# Patient Record
Sex: Male | Born: 1935
Health system: Southern US, Community
[De-identification: ages and names within clinical notes are randomized; demographics above are authoritative.]

## PROBLEM LIST (undated history)

## (undated) DIAGNOSIS — M1990 Unspecified osteoarthritis, unspecified site: Secondary | ICD-10-CM

## (undated) DIAGNOSIS — C801 Malignant (primary) neoplasm, unspecified: Secondary | ICD-10-CM

## (undated) DIAGNOSIS — C4490 Unspecified malignant neoplasm of skin, unspecified: Secondary | ICD-10-CM

## (undated) DIAGNOSIS — H9190 Unspecified hearing loss, unspecified ear: Secondary | ICD-10-CM

## (undated) DIAGNOSIS — C439 Malignant melanoma of skin, unspecified: Secondary | ICD-10-CM

## (undated) DIAGNOSIS — M545 Low back pain, unspecified: Secondary | ICD-10-CM

## (undated) DIAGNOSIS — I10 Essential (primary) hypertension: Secondary | ICD-10-CM

## (undated) HISTORY — DX: Unspecified malignant neoplasm of skin, unspecified: C44.90

## (undated) HISTORY — DX: Essential (primary) hypertension: I10

## (undated) HISTORY — DX: Malignant (primary) neoplasm, unspecified: C80.1

## (undated) HISTORY — DX: Low back pain, unspecified: M54.50

## (undated) HISTORY — DX: Malignant melanoma of skin, unspecified: C43.9

## (undated) HISTORY — DX: Unspecified osteoarthritis, unspecified site: M19.90

## (undated) HISTORY — DX: Unspecified hearing loss, unspecified ear: H91.90

---

## 2004-06-16 ENCOUNTER — Ambulatory Visit: Admission: RE | Admit: 2004-06-16 | Payer: Self-pay | Source: Ambulatory Visit | Admitting: Gastroenterology

## 2005-10-26 HISTORY — PX: INGUINAL HERNIA REPAIR: SUR1180

## 2005-10-26 HISTORY — PX: EXCISION, MELANOMA: SHX3990

## 2006-09-25 DIAGNOSIS — C439 Malignant melanoma of skin, unspecified: Secondary | ICD-10-CM | POA: Insufficient documentation

## 2012-10-28 NOTE — Op Note (Unsigned)
DATE OF BIRTH:                        [birth year]      ADMISSION DATE:                     06/16/2004            PATIENT LOCATION:                     END [facility] 24            DATE OF PROCEDURE:                   06/16/2004      SURGEON:                            Homero Fellers, MD      ASSISTANT(S):                  PREOPERATIVE DIAGNOSIS:  ROUTINE COLORECTAL CANCER SCREENING IN A PATIENT      WITH NORMAL AGE-RELATED RISK FOR COLON CANCER.            POSTOPERATIVE DIAGNOSIS:  MILDLY CONGESTED INTERNAL HEMORRHOIDS, OTHERWISE      NORMAL COLONOSCOPY.            PROCEDURE:  COLONOSCOPY.            SCOPE:  Olympus video colonoscope.            PREMEDICATIONS:  Propofol.            DESCRIPTION OF PROCEDURE:  Perianal inspection was unrevealing.  Digital      examination reveals no masses.  The scope was passed transanally and      further advanced to the base of cecum and the appendiceal orifice is      visualized (photo documentation).  The ileocecal valve is well seen, (photo      documentation).  Bowel prep was good.  The scope was slowly withdrawn      demonstrating a normal colon without polyps or visualized diverticulosis.      There were mildly congested internal hemorrhoids visualized on withdrawal.      Retroflex views of the anorectum was otherwise unrevealing (photo      documentation).  The scope was withdrawn.  The procedure was tolerated well      without overt complications.            RECOMMENDATIONS:   A next screening colonoscopy may now be deferred for up      to  10 years according to current concensus guidelines.                                          ___________________________________          Date Signed: __________      Homero Fellers, MD  ([id number])            D: 06/16/2004 by Homero Fellers, MD      T: 06/16/2004 by [id number] ([id number]) ([id number])      cc:  Homero Fellers, MD            Dr. Cruzita Lederer, [address]  [phone], [place], [place]. [phone]

## 2014-06-23 ENCOUNTER — Emergency Department
Admission: EM | Admit: 2014-06-23 | Discharge: 2014-06-23 | Disposition: A | Discharge status: Home / Self Care | Payer: BLUE CROSS/BLUE SHIELD

## 2014-06-23 ENCOUNTER — Emergency Department: Payer: BLUE CROSS/BLUE SHIELD

## 2014-06-23 DIAGNOSIS — N476 Balanoposthitis: Secondary | ICD-10-CM | POA: Insufficient documentation

## 2014-06-23 DIAGNOSIS — N390 Urinary tract infection, site not specified: Secondary | ICD-10-CM

## 2014-06-23 LAB — URINALYSIS
Bilirubin, UA: NEGATIVE
Glucose, UA: NEGATIVE
Nitrite, UA: NEGATIVE
Protein, UR: 100 — AB
Specific Gravity UA: 1.025 (ref 1.001–1.035)
Urine pH: 6 (ref 5.0–8.0)
Urobilinogen, UA: 0.2 mg/dL (ref 0.2–2.0)

## 2014-06-23 LAB — URINE MICROSCOPIC

## 2014-06-23 MED ORDER — CEFDINIR 300 MG PO CAPS
300.0000 mg | ORAL_CAPSULE | Freq: Two times a day (BID) | ORAL | Status: DC
Start: 2014-06-23 — End: 2015-12-03

## 2014-06-23 MED ORDER — PHENAZOPYRIDINE HCL 200 MG PO TABS
200.0000 mg | ORAL_TABLET | Freq: Three times a day (TID) | ORAL | Status: DC | PRN
Start: 2014-06-23 — End: 2015-12-03

## 2014-06-23 MED ORDER — TRIAMCINOLONE ACETONIDE 0.1 % EX CREA
TOPICAL_CREAM | Freq: Two times a day (BID) | CUTANEOUS | Status: DC
Start: 2014-06-23 — End: 2015-12-03

## 2014-06-23 NOTE — Discharge Instructions (Signed)
Urinary Tract Infection    You have been diagnosed with a lower urinary tract infection (UTI). This is also called cystitis.also you appear to have some infection of foreskin/ followup by your dr    Cystitis is an infection in your bladder. Your doctor diagnosed it by testing your urine. Cystitis usually causes burning with urination or frequent urination. It might make you feel like you have to urinate even when you don't.     Cystitis is usually treated with antibiotics and medicine to help with pain.    It is VERY IMPORTANT that you fill your prescription and take all of the antibiotics as directed. If a lower urinary tract infection goes untreated for too long, it can become a kidney infection.    FOR WOMEN: To reduce the risk of getting cystitis again:   Always urinate before and after sexual intercourse.   Always wipe from front to back after urinating or having a bowel movement. Do not wipe from back to front.   Drink plenty of fluids. Try to drink cranberry or blueberry juice. These juices have a chemical that stops bacteria from "sticking" to the bladder.    YOU SHOULD SEEK MEDICAL ATTENTION IMMEDIATELY, EITHER HERE OR AT THE NEAREST EMERGENCY DEPARTMENT, IF ANY OF THE FOLLOWING OCCURS:   You have a fever (temperature higher than 100.46F / 38C) or shaking chills.   You feel nauseated or vomit.   You have pain in your side or back.   You don't get better after taking all of your antibiotics.   You have any new symptoms or concerns.   You feel worse or do not improve.

## 2014-06-23 NOTE — ED Provider Notes (Addendum)
Physician/Midlevel provider first contact with patient: 06/23/14 1723         History     Chief Complaint   Patient presents with   . Urinary Frequency   . Abdominal Pain     Patient is a 80 y.o. male presenting with frequency and abdominal pain. The history is provided by the patient.   Urinary Frequency  This is a new problem. The current episode started in the past 7 days. Associated symptoms include urinary symptoms. Pertinent negatives include no abdominal pain. Associated symptoms comments: Burning urination/ no pain no back pain only noticed during urination.   Abdominal Pain        Past Medical History   Diagnosis Date   . Melanoma        Past Surgical History   Procedure Laterality Date   . Inguinal hernia repair Left        No family history on file.    Social  History   Substance Use Topics   . Smoking status: Never Smoker    . Smokeless tobacco: Not on file   . Alcohol Use: Yes      Comment: 7/week       .     No Known Allergies    Discharge Medication List as of 06/23/2014  6:07 PM           Review of Systems   Gastrointestinal: Negative for abdominal pain.   Genitourinary: Positive for frequency.   All other systems reviewed and are negative.      Physical Exam    BP: 179/82 mmHg, Heart Rate: 83, Temp: 98.1 F (36.7 C), Resp Rate: 15, SpO2: 98 %, Weight: 64.864 kg    Physical Exam   Constitutional: He appears well-developed and well-nourished.   Abdominal: Soft. Bowel sounds are normal. He exhibits no distension. There is no tenderness. There is no rebound.   Genitourinary: No penile tenderness.   Uncircumcised/ somewhat redundant foreskin / no disch but tight foreskin and area is erythematous   Musculoskeletal: Normal range of motion.   Neurological: He is alert. Coordination normal.   Skin: Skin is warm and dry.   Foreskin area sl red / no disch       MDM and ED Course     ED Medication Orders     None           MDM      Procedures    Clinical Impression & Disposition     Clinical Impression  Final  diagnoses:   UTI (lower urinary tract infection)   Balanoposthitis        ED Disposition     Discharge Leona Manni discharge to home/self care.    Condition at disposition: Stable             Discharge Medication List as of 06/23/2014  6:07 PM      START taking these medications    Details   cefdinir (OMNICEF) 300 MG capsule Take 1 capsule (300 mg total) by mouth 2 (two) times daily., Starting 06/23/2014, Until Discontinued, Print      phenazopyridine (PYRIDIUM) 200 MG tablet Take 1 tablet (200 mg total) by mouth 3 (three) times daily as needed for Pain (as needed for pain or burning)., Starting 06/23/2014, Until Discontinued, Print      triamcinolone (KENALOG) 0.1 % cream Apply topically 2 (two) times daily., Starting 06/23/2014, Until Discontinued, Print  Ok Anis, MD  06/23/14 1807    Ok Anis, MD  06/24/14 [phone]

## 2014-06-23 NOTE — ED Notes (Signed)
Urinary frequency over a month without a focus found by PCP 3 weeks ago.  Yesterday the urine became cloudy and frequency increased a lot.  Intense lower abdominal pain with urination, does not need to push, but only small amounts at a time.

## 2015-12-03 ENCOUNTER — Ambulatory Visit: Payer: BLUE CROSS/BLUE SHIELD

## 2015-12-03 NOTE — Pre-Procedure Instructions (Addendum)
   Labs/EKG done Nov/Dec 2016 requested from Catherine/PMD's office    No pre-op clearances requested   LOV note 04/2015 requested from Heidi/med onc's office.

## 2015-12-23 ENCOUNTER — Ambulatory Visit
Admission: RE | Admit: 2015-12-23 | Payer: BLUE CROSS/BLUE SHIELD | Source: Ambulatory Visit | Admitting: Gastroenterology

## 2015-12-23 ENCOUNTER — Encounter: Admission: RE | Payer: Self-pay | Source: Ambulatory Visit

## 2015-12-23 SURGERY — DONT USE, USE 1094-COLONOSCOPY, DIAGNOSTIC (SCREENING)
Anesthesia: IV Sedation | Site: Anus

## 2016-10-07 DIAGNOSIS — E785 Hyperlipidemia, unspecified: Secondary | ICD-10-CM | POA: Insufficient documentation

## 2018-03-09 DIAGNOSIS — M25562 Pain in left knee: Secondary | ICD-10-CM | POA: Insufficient documentation

## 2018-03-09 DIAGNOSIS — M1990 Unspecified osteoarthritis, unspecified site: Secondary | ICD-10-CM | POA: Insufficient documentation

## 2018-03-09 DIAGNOSIS — M47812 Spondylosis without myelopathy or radiculopathy, cervical region: Secondary | ICD-10-CM | POA: Insufficient documentation

## 2018-03-17 ENCOUNTER — Emergency Department
Admission: EM | Admit: 2018-03-17 | Discharge: 2018-03-18 | Disposition: A | Discharge status: Home / Self Care | Payer: BLUE CROSS/BLUE SHIELD | Attending: Emergency Medicine | Admitting: Emergency Medicine

## 2018-03-17 ENCOUNTER — Emergency Department: Payer: BLUE CROSS/BLUE SHIELD

## 2018-03-17 DIAGNOSIS — R55 Syncope and collapse: Secondary | ICD-10-CM | POA: Insufficient documentation

## 2018-03-17 DIAGNOSIS — Z79899 Other long term (current) drug therapy: Secondary | ICD-10-CM | POA: Insufficient documentation

## 2018-03-17 DIAGNOSIS — I10 Essential (primary) hypertension: Secondary | ICD-10-CM | POA: Insufficient documentation

## 2018-03-17 DIAGNOSIS — Z85820 Personal history of malignant melanoma of skin: Secondary | ICD-10-CM | POA: Insufficient documentation

## 2018-03-17 LAB — CBC AND DIFFERENTIAL
Absolute NRBC: 0 10*3/uL (ref 0.00–0.00)
Basophils Absolute Automated: 0.05 10*3/uL (ref 0.00–0.08)
Basophils Automated: 0.6 %
Eosinophils Absolute Automated: 0.26 10*3/uL (ref 0.00–0.44)
Eosinophils Automated: 3 %
Hematocrit: 39.9 % (ref 37.6–49.6)
Hgb: 13 g/dL (ref 12.5–17.1)
Immature Granulocytes Absolute: 0.02 10*3/uL (ref 0.00–0.07)
Immature Granulocytes: 0.2 %
Lymphocytes Absolute Automated: 2.24 10*3/uL (ref 0.42–3.22)
Lymphocytes Automated: 26 %
MCH: 31.9 pg (ref 25.1–33.5)
MCHC: 32.6 g/dL (ref 31.5–35.8)
MCV: 97.8 fL — ABNORMAL HIGH (ref 78.0–96.0)
MPV: 10.4 fL (ref 8.9–12.5)
Monocytes Absolute Automated: 1.1 10*3/uL — ABNORMAL HIGH (ref 0.21–0.85)
Monocytes: 12.8 %
Neutrophils Absolute: 4.93 10*3/uL (ref 1.10–6.33)
Neutrophils: 57.4 %
Nucleated RBC: 0 /100 WBC (ref 0.0–0.0)
Platelets: 230 10*3/uL (ref 142–346)
RBC: 4.08 10*6/uL — ABNORMAL LOW (ref 4.20–5.90)
RDW: 13 % (ref 11–15)
WBC: 8.6 10*3/uL (ref 3.10–9.50)

## 2018-03-17 LAB — PT AND APTT
PT INR: 1 (ref 0.9–1.1)
PT: 13.4 s (ref 12.6–15.0)
PTT: 23 s (ref 23–37)

## 2018-03-17 LAB — COMPREHENSIVE METABOLIC PANEL
ALT: 19 U/L (ref 0–55)
AST (SGOT): 22 U/L (ref 5–34)
Albumin/Globulin Ratio: 1.3 (ref 0.9–2.2)
Albumin: 4.2 g/dL (ref 3.5–5.0)
Alkaline Phosphatase: 89 U/L (ref 38–106)
Anion Gap: 15 (ref 5.0–15.0)
BUN: 13 mg/dL (ref 9.0–28.0)
Bilirubin, Total: 0.6 mg/dL (ref 0.2–1.2)
CO2: 22 mEq/L (ref 22–29)
Calcium: 8.9 mg/dL (ref 7.9–10.2)
Chloride: 106 mEq/L (ref 100–111)
Creatinine: 1 mg/dL (ref 0.7–1.3)
Globulin: 3.2 g/dL (ref 2.0–3.6)
Glucose: 123 mg/dL — ABNORMAL HIGH (ref 70–100)
Potassium: 4.2 mEq/L (ref 3.5–5.1)
Protein, Total: 7.4 g/dL (ref 6.0–8.3)
Sodium: 143 mEq/L (ref 136–145)

## 2018-03-17 LAB — ETHANOL: Alcohol: 12 mg/dL — ABNORMAL HIGH

## 2018-03-17 LAB — GFR: EGFR: >60

## 2018-03-17 LAB — MAGNESIUM: Magnesium: 2.1 mg/dL (ref 1.6–2.6)

## 2018-03-17 LAB — TROPONIN I: Troponin I: <0.01 ng/mL (ref 0.00–0.09)

## 2018-03-17 LAB — B-TYPE NATRIURETIC PEPTIDE: B-Natriuretic Peptide: 65 pg/mL (ref 0–100)

## 2018-03-17 MED ORDER — ONDANSETRON HCL 4 MG/2ML IJ SOLN
4.0000 mg | Freq: Once | INTRAMUSCULAR | Status: AC
Start: 2018-03-17 — End: 2018-03-17
  Administered 2018-03-17: 4 mg via INTRAVENOUS
  Filled 2018-03-17: qty 2

## 2018-03-17 MED ORDER — SODIUM CHLORIDE 0.9 % IV BOLUS
1000.0000 mL | Freq: Once | INTRAVENOUS | Status: AC
Start: 2018-03-17 — End: 2018-03-17
  Administered 2018-03-17: 1000 mL via INTRAVENOUS

## 2018-03-17 NOTE — ED Provider Notes (Signed)
Physician/Midlevel provider first contact with patient: 03/17/18 2048         History     Chief Complaint   Patient presents with   . Loss of Consciousness   . Emesis     Pt is a 84 yo male with hx of HTN, RA, etc., who comes to ER via EMS for evaluation of a sudden onset of weakness with lightheadness and syncopal episode prior to arrival. Pt was sitting at a table , eating, started to feel weak, denies any headache or chest pain at the time. Pt was witnessed to slump over the table, + syncope for few seconds, family laid him on a floor and raised his legs up, he woke up, this was around 2015. He was then able to ambulate with assistance to the bathroom and vomited there several times. EMS were called and brought pt to ER .   In ER, states he is still feeling weak and nauseous. Denies any headache or chest pain.   States today he has been feeling "off all today, didn't eat much at all and then went to the rehersal dinner". His daughter is getting married tomorrow.   No prior hx of syncopal episode.         The history is provided by the patient.            Past Medical History:   Diagnosis Date   . Arthritis     both hands   . Hearing loss, unspecified laterality     bilaterally, mild loss   . Hypertension    . Low back pain     occasionally   . Malignant neoplasm     Melanoma - LN in groin positive 10/2011   . Malignant neoplasm of skin     Melanoma - back - surgically removed   . Melanoma        Past Surgical History:   Procedure Laterality Date   . EXCISION, MELANOMA  2007    Removed from back and groin LN tested positive   . INGUINAL HERNIA REPAIR Left 2007       History reviewed. No pertinent family history.    Social  Social History   Substance Use Topics   . Smoking status: Former Smoker     Packs/day: 0.50     Years: 4.00   . Smokeless tobacco: Former Neurosurgeon     Quit date: 12/02/1957      Comment: Smoked pipe until 2000 infrequently   . Alcohol use 4.2 oz/week     7 Glasses of wine per week      Comment: 7/week        .     No Known Allergies    Home Medications     Med List Status:  In Progress Set By: Candelaria Celeste, RN at 03/17/2018  8:50 PM                ramipril (ALTACE) 2.5 MG capsule     TAKE 1 CAPSULE(S) EVERY DAY BY ORAL ROUTE AS DIRECTED.           Review of Systems   Constitutional: Negative for chills and fever.   HENT: Negative for sore throat.    Respiratory: Negative for cough.    Cardiovascular: Negative for chest pain and leg swelling.   Gastrointestinal: Negative for abdominal pain, nausea and vomiting.   Genitourinary: Negative.    Musculoskeletal: Negative for back pain.   Skin: Negative for rash.  Neurological: Positive for dizziness, syncope and light-headedness. Negative for speech difficulty, weakness, numbness and headaches.   Psychiatric/Behavioral: Negative.    All other systems reviewed and are negative.      Physical Exam    BP: 137/73, Heart Rate: (!) 59, Temp: 97.9 F (36.6 C), Resp Rate: 22, SpO2: 99 %, Weight: 65.8 kg    Physical Exam   Constitutional: He is oriented to person, place, and time. He appears well-developed and well-nourished. No distress.   Weak appearing   HENT:   Head: Normocephalic and atraumatic.   Right Ear: External ear normal.   Left Ear: External ear normal.   Nose: Nose normal.   Mouth/Throat: Oropharynx is clear and moist.   Eyes: Pupils are equal, round, and reactive to light. Conjunctivae and EOM are normal.   Neck: Normal range of motion. Neck supple.   Cardiovascular: Normal rate, regular rhythm, normal heart sounds and intact distal pulses.    Pulmonary/Chest: Effort normal and breath sounds normal. No respiratory distress.   Abdominal: Soft. Bowel sounds are normal. There is no tenderness.   Musculoskeletal: Normal range of motion. He exhibits no edema.   Neurological: He is alert and oriented to person, place, and time. He has normal reflexes. No cranial nerve deficit. Coordination normal.   Skin: Skin is warm and dry. Capillary refill takes less than 2  seconds. He is not diaphoretic.   Psychiatric: He has a normal mood and affect. His behavior is normal. Judgment and thought content normal.   Nursing note and vitals reviewed.        MDM and ED Course     ED Medication Orders     Start Ordered     Status Ordering Provider    03/17/18 2049 03/17/18 2048  ondansetron (ZOFRAN) injection 4 mg  Once     Route: Intravenous  Ordered Dose: 4 mg     Last MAR action:  Given Tiphani Mells J    03/17/18 2049 03/17/18 2048  sodium chloride 0.9 % bolus 1,000 mL  Once     Route: Intravenous  Ordered Dose: 1,000 mL     Last MAR action:  Stopped Danijah Noh J             MDM  Number of Diagnoses or Management Options  Syncope, vasovagal:   Diagnosis management comments: I, Phyllis Ginger PA-C, have been the primary provider for Jason Valentine during this Emergency Dept visit.    The attending signature signifies review and agreement of the history, physical examination, evaluation, clinical impression and plan except as noted.     Oxygen saturation by pulse oximetry is 95%-100%, Normal.  Interventions: None Needed and Patient Observed.    This chart was generated by the Epic EMR system/speech recognition and may contain inherent errors or omissions not intended by the user. Grammatical errors, random word insertions, deletions, pronoun errors and incomplete sentences are occasional consequences of this technology due to software limitations. Not all errors are caught or corrected. If there are questions or concerns about the content of this note or information contained within the body of this dictation they should be addressed directly with the author for clarification    EKG Interpretation    Rate: Normal  Rhythm: sinus rhythm  Axis: Normal  ST-T Segments: no ST elevation    Conduction: RBBB (Complete)  Impression: Non-specific EKG    EKG interpreted by ER MD    Xr Chest  Ap Portable    Result Date:  03/17/2018  1. No acute findings. Emeline Darling, MD 03/17/2018 10:40  PM    11:00 PM  Pt well appearing, wife at bedside. Pt denies any discomfort. Drinking water. Repeat Trop/EKG at midnight.  Dr. Salli Real will disposition pt as appropriate           Amount and/or Complexity of Data Reviewed  Clinical lab tests: ordered and reviewed  Tests in the radiology section of CPT: ordered and reviewed      Results     Procedure Component Value Units Date/Time    Troponin I - Repeat [[id number]] Collected:  03/17/18 2357    Specimen:  Blood Updated:  03/18/18 0030     Troponin I <0.01 ng/mL     B-type Natriuretic Peptide [[id number]] Collected:  03/17/18 2054    Specimen:  Blood Updated:  03/17/18 2131     B-Natriuretic Peptide 65 pg/mL     Troponin I [[id number]] Collected:  03/17/18 2054    Specimen:  Blood Updated:  03/17/18 2131     Troponin I <0.01 ng/mL     Ethanol (Alcohol) Level [[id number]]  (Abnormal) Collected:  03/17/18 2054    Specimen:  Blood Updated:  03/17/18 2131     Alcohol 12 (H) mg/dL     GFR [[id number]] Collected:  03/17/18 2054     Updated:  03/17/18 2131     EGFR >60.0    Comprehensive metabolic panel [[id number]]  (Abnormal) Collected:  03/17/18 2054    Specimen:  Blood Updated:  03/17/18 2131     Glucose 123 (H) mg/dL      BUN 53.6 mg/dL      Creatinine 1.0 mg/dL      Sodium 644 mEq/L      Potassium 4.2 mEq/L      Chloride 106 mEq/L      CO2 22 mEq/L      Calcium 8.9 mg/dL      Protein, Total 7.4 g/dL      Albumin 4.2 g/dL      AST (SGOT) 22 U/L      ALT 19 U/L      Alkaline Phosphatase 89 U/L      Bilirubin, Total 0.6 mg/dL      Globulin 3.2 g/dL      Albumin/Globulin Ratio 1.3     Anion Gap 15.0    Magnesium [[id number]] Collected:  03/17/18 2054    Specimen:  Blood Updated:  03/17/18 2131     Magnesium 2.1 mg/dL     PT/APTT [[id number]] Collected:  03/17/18 2054     Updated:  03/17/18 2123     PT 13.4 sec      PT INR 1.0     PT Anticoag. Given Within 48 hrs. None     PTT 23 sec     CBC with differential [[id number]]  (Abnormal) Collected:  03/17/18 2054    Specimen:   Blood from Blood Updated:  03/17/18 2108     WBC 8.60 x10 3/uL      Hgb 13.0 g/dL      Hematocrit 60.6 %      Platelets 230 x10 3/uL      RBC 4.08 (L) x10 6/uL      MCV 97.8 (H) fL      MCH 31.9 pg      MCHC 32.6 g/dL      RDW 13 %      MPV 10.4 fL      Neutrophils 57.4 %  Lymphocytes Automated 26.0 %      Monocytes 12.8 %      Eosinophils Automated 3.0 %      Basophils Automated 0.6 %      Immature Granulocyte 0.2 %      Nucleated RBC 0.0 /100 WBC      Neutrophils Absolute 4.93 x10 3/uL      Abs Lymph Automated 2.24 x10 3/uL      Abs Mono Automated 1.10 (H) x10 3/uL      Abs Eos Automated 0.26 x10 3/uL      Absolute Baso Automated 0.05 x10 3/uL      Absolute Immature Granulocyte 0.02 x10 3/uL      Absolute NRBC 0.00 x10 3/uL                    Heart Score      Value   History  0   EKG  0   Risk Factors  1   Total (with age)  3   Onset of pain (time of START of last episode of chest pain)?  0-3 hrs ago          Procedures    Clinical Impression & Disposition     Clinical Impression  Final diagnoses:   Syncope, vasovagal        ED Disposition     ED Disposition Condition Date/Time Comment    Discharge  Fri Mar 18, 2018 12:49 AM Jason Valentine discharge to home/self care.    Condition at disposition: Stable           Discharge Medication List as of 03/18/2018 12:49 AM                    Paula Compton, PA  03/18/18 1341       Leonia Reeves, MD  03/26/18 [phone]

## 2018-03-17 NOTE — ED Triage Notes (Signed)
Per wife pt was sitting and started slumping in chair, felt weak, sweaty and pale, sit with assist, pulse weak, loss consciousness, laid on floor and raised legs resulted in return to consciousness at 2015 tonight. Vomited in bathroom after episode, + nausea, alert and oriented.

## 2018-03-18 LAB — TROPONIN I: Troponin I: <0.01 ng/mL (ref 0.00–0.09)

## 2018-03-18 NOTE — Discharge Instructions (Signed)
Dizziness, Nonspecific    You have been seen for dizziness.     Dizziness can mean different things to different people. Some people use dizziness to mean the feeling of spinning when there is no actual movement. This often causes nausea (feeling sick). The medical term for this is "vertigo." Others people use the word dizzy to mean "feeling lightheaded," like you might faint. This feeling is usually made better when lying down. For some people, neither of these describes how they are feeling. It can just be a feeling that makes you unsteady. This feeling is common in older people. It can be caused by a number of things. These include poor vision or hearing, foot problems and arthritis. It can also be caused by middle ear or sinus problems. The feeling can come and go.    Dizziness is also caused by more serious things. This includes strokes and heart problems.    It is NEVER normal to have the kind of dizziness you have today together with:   Chest pain.   Problems walking because of problems with balance. Especially if you are falling to one side.   Weakness, numbness or tingling in a part of your body.   Drooping of one side of your face.   Confusion.   Severe headache.   Problems speaking.    If you have these symptoms, it is VERY IMPORTANT to go to the nearest emergency department.    Your tests today were negative (normal). This means we found no life-threatening causes for your dizziness. It is OK for you to go home.    See your primary care doctor for more work-up of your dizziness.     YOU SHOULD SEEK MEDICAL ATTENTION IMMEDIATELY, EITHER HERE OR AT THE NEAREST EMERGENCY DEPARTMENT, IF ANY OF THE FOLLOWING OCCUR:   You cannot speak clearly (slurring), one side of your face droops or you feel weak in the arms or legs (especially on one side).   You have problems with your balance.   You have problems hearing or there is ringing or a feeling of fullness in your ear.   You lose consciousness  ("pass out" or faint).   You have severe headache with dizziness.   You have fever (temperature higher than 100.4F / 38C).   You fall and hit your head.               Vasovagal Syncope    You have been seen for vasovagal syncope.    Vasovagal syncope is the medical term for fainting. It happens when your blood pressure changes so quickly that your body cannot make up for it. This causes loss of consciousness that comes on quickly and lasts only a short time. Many people with vasovagal syncope have warning signs before they faint. These signs might include nausea, weakness, sweating, or blurred vision.     Causes of vasovagal syncope include:   Standing for a long time with the knees locked.   Emotional stress (fear, excitement).   Hot or crowed environments.   Illness, fatigue, dehydration, hypoglycemia (low blood sugar).    The cause of your fainting is not life-threatening. Still, you should avoid strenuous activity for the next 24 hours and drink plenty of fluids. If you are diabetic, keep good control over your blood sugar.    See your family doctor within 72 hours.    YOU SHOULD SEEK MEDICAL ATTENTION IMMEDIATELY, EITHER HERE OR AT THE NEAREST EMERGENCY DEPARTMENT IF ANY OF THE   FOLLOWING OCCUR:   You continue to faint.   Your fainting occurs with chest pain or a very fast heart rate (more than 120 beats per minute).   You notice any blood in your stool.   You feel weak or lightheaded.   Your symptoms get worse.   You have any other concerns.

## 2018-03-19 LAB — ECG 12-LEAD
Atrial Rate: 63 {beats}/min
Atrial Rate: 73 {beats}/min
P Axis: 56 degrees
P Axis: 62 degrees
P-R Interval: 174 ms
P-R Interval: 178 ms
Q-T Interval: 410 ms
Q-T Interval: 446 ms
QRS Duration: 120 ms
QRS Duration: 124 ms
QTC Calculation (Bezet): 451 ms
QTC Calculation (Bezet): 456 ms
R Axis: 27 degrees
R Axis: 30 degrees
T Axis: 24 degrees
T Axis: 28 degrees
Ventricular Rate: 63 {beats}/min
Ventricular Rate: 73 {beats}/min

## 2018-04-08 DIAGNOSIS — G479 Sleep disorder, unspecified: Secondary | ICD-10-CM | POA: Insufficient documentation

## 2018-04-08 DIAGNOSIS — R55 Syncope and collapse: Secondary | ICD-10-CM | POA: Insufficient documentation

## 2018-04-08 DIAGNOSIS — E860 Dehydration: Secondary | ICD-10-CM | POA: Insufficient documentation

## 2018-04-08 DIAGNOSIS — G2581 Restless legs syndrome: Secondary | ICD-10-CM | POA: Insufficient documentation

## 2018-10-24 DIAGNOSIS — K5730 Diverticulosis of large intestine without perforation or abscess without bleeding: Secondary | ICD-10-CM | POA: Insufficient documentation

## 2018-10-24 DIAGNOSIS — K8020 Calculus of gallbladder without cholecystitis without obstruction: Secondary | ICD-10-CM | POA: Insufficient documentation

## 2019-01-20 DIAGNOSIS — M79671 Pain in right foot: Secondary | ICD-10-CM | POA: Insufficient documentation

## 2019-04-10 DIAGNOSIS — R634 Abnormal weight loss: Secondary | ICD-10-CM | POA: Insufficient documentation

## 2019-05-26 DIAGNOSIS — R079 Chest pain, unspecified: Secondary | ICD-10-CM | POA: Insufficient documentation

## 2019-05-26 DIAGNOSIS — R059 Cough, unspecified: Secondary | ICD-10-CM | POA: Insufficient documentation

## 2019-08-24 DIAGNOSIS — M898X8 Other specified disorders of bone, other site: Secondary | ICD-10-CM | POA: Insufficient documentation

## 2019-08-24 DIAGNOSIS — L290 Pruritus ani: Secondary | ICD-10-CM | POA: Insufficient documentation

## 2019-08-29 DIAGNOSIS — I10 Essential (primary) hypertension: Secondary | ICD-10-CM | POA: Insufficient documentation

## 2019-08-29 DIAGNOSIS — Z85820 Personal history of malignant melanoma of skin: Secondary | ICD-10-CM | POA: Insufficient documentation

## 2019-12-07 DIAGNOSIS — R69 Illness, unspecified: Secondary | ICD-10-CM | POA: Diagnosis not present

## 2020-01-15 DIAGNOSIS — Z87440 Personal history of urinary (tract) infections: Secondary | ICD-10-CM | POA: Diagnosis not present

## 2020-01-15 DIAGNOSIS — Z7689 Persons encountering health services in other specified circumstances: Secondary | ICD-10-CM | POA: Diagnosis not present

## 2020-01-15 DIAGNOSIS — R7989 Other specified abnormal findings of blood chemistry: Secondary | ICD-10-CM | POA: Diagnosis not present

## 2020-01-15 DIAGNOSIS — G2581 Restless legs syndrome: Secondary | ICD-10-CM | POA: Diagnosis not present

## 2020-01-15 DIAGNOSIS — I10 Essential (primary) hypertension: Secondary | ICD-10-CM | POA: Diagnosis not present

## 2020-01-15 DIAGNOSIS — H9190 Unspecified hearing loss, unspecified ear: Secondary | ICD-10-CM | POA: Diagnosis not present

## 2020-01-15 DIAGNOSIS — Z1331 Encounter for screening for depression: Secondary | ICD-10-CM | POA: Diagnosis not present

## 2020-01-15 DIAGNOSIS — Z85820 Personal history of malignant melanoma of skin: Secondary | ICD-10-CM | POA: Diagnosis not present

## 2020-02-15 DIAGNOSIS — H903 Sensorineural hearing loss, bilateral: Secondary | ICD-10-CM | POA: Diagnosis not present

## 2020-02-15 DIAGNOSIS — Z822 Family history of deafness and hearing loss: Secondary | ICD-10-CM | POA: Diagnosis not present

## 2020-02-15 DIAGNOSIS — H9313 Tinnitus, bilateral: Secondary | ICD-10-CM | POA: Diagnosis not present

## 2020-03-28 DIAGNOSIS — R69 Illness, unspecified: Secondary | ICD-10-CM | POA: Diagnosis not present

## 2020-04-15 DIAGNOSIS — H9190 Unspecified hearing loss, unspecified ear: Secondary | ICD-10-CM | POA: Diagnosis not present

## 2020-04-15 DIAGNOSIS — E611 Iron deficiency: Secondary | ICD-10-CM | POA: Diagnosis not present

## 2020-04-15 DIAGNOSIS — G2581 Restless legs syndrome: Secondary | ICD-10-CM | POA: Diagnosis not present

## 2020-04-15 DIAGNOSIS — M7062 Trochanteric bursitis, left hip: Secondary | ICD-10-CM | POA: Diagnosis not present

## 2020-04-15 DIAGNOSIS — I10 Essential (primary) hypertension: Secondary | ICD-10-CM | POA: Diagnosis not present

## 2020-04-15 DIAGNOSIS — H547 Unspecified visual loss: Secondary | ICD-10-CM | POA: Diagnosis not present

## 2020-06-10 DIAGNOSIS — R69 Illness, unspecified: Secondary | ICD-10-CM | POA: Diagnosis not present

## 2020-06-12 DIAGNOSIS — H25813 Combined forms of age-related cataract, bilateral: Secondary | ICD-10-CM | POA: Diagnosis not present

## 2020-06-24 DIAGNOSIS — Z0100 Encounter for examination of eyes and vision without abnormal findings: Secondary | ICD-10-CM | POA: Diagnosis not present

## 2020-09-20 DIAGNOSIS — Z125 Encounter for screening for malignant neoplasm of prostate: Secondary | ICD-10-CM | POA: Diagnosis not present

## 2020-09-20 DIAGNOSIS — I10 Essential (primary) hypertension: Secondary | ICD-10-CM | POA: Diagnosis not present

## 2020-09-24 DIAGNOSIS — R69 Illness, unspecified: Secondary | ICD-10-CM | POA: Diagnosis not present

## 2020-09-26 DIAGNOSIS — Z23 Encounter for immunization: Secondary | ICD-10-CM | POA: Diagnosis not present

## 2020-09-26 DIAGNOSIS — Z85820 Personal history of malignant melanoma of skin: Secondary | ICD-10-CM | POA: Diagnosis not present

## 2020-09-26 DIAGNOSIS — I10 Essential (primary) hypertension: Secondary | ICD-10-CM | POA: Diagnosis not present

## 2020-09-26 DIAGNOSIS — G2581 Restless legs syndrome: Secondary | ICD-10-CM | POA: Diagnosis not present

## 2020-09-26 DIAGNOSIS — M7062 Trochanteric bursitis, left hip: Secondary | ICD-10-CM | POA: Diagnosis not present

## 2020-09-26 DIAGNOSIS — E611 Iron deficiency: Secondary | ICD-10-CM | POA: Diagnosis not present

## 2020-09-26 DIAGNOSIS — Z0000 Encounter for general adult medical examination without abnormal findings: Secondary | ICD-10-CM | POA: Diagnosis not present

## 2020-09-26 DIAGNOSIS — H9190 Unspecified hearing loss, unspecified ear: Secondary | ICD-10-CM | POA: Diagnosis not present

## 2020-09-26 DIAGNOSIS — R1310 Dysphagia, unspecified: Secondary | ICD-10-CM | POA: Diagnosis not present

## 2020-10-09 ENCOUNTER — Telehealth: Payer: Self-pay | Admitting: Hematology & Oncology

## 2020-10-09 NOTE — Telephone Encounter (Signed)
Called and spoke with patient regarding appointments added .  NP letter & calendar was mailed

## 2020-10-14 DIAGNOSIS — D2272 Melanocytic nevi of left lower limb, including hip: Secondary | ICD-10-CM | POA: Diagnosis not present

## 2020-10-14 DIAGNOSIS — L821 Other seborrheic keratosis: Secondary | ICD-10-CM | POA: Diagnosis not present

## 2020-10-14 DIAGNOSIS — Z85820 Personal history of malignant melanoma of skin: Secondary | ICD-10-CM | POA: Diagnosis not present

## 2020-10-14 DIAGNOSIS — D2271 Melanocytic nevi of right lower limb, including hip: Secondary | ICD-10-CM | POA: Diagnosis not present

## 2020-10-14 DIAGNOSIS — D225 Melanocytic nevi of trunk: Secondary | ICD-10-CM | POA: Diagnosis not present

## 2020-10-14 DIAGNOSIS — D1801 Hemangioma of skin and subcutaneous tissue: Secondary | ICD-10-CM | POA: Diagnosis not present

## 2020-10-14 DIAGNOSIS — L80 Vitiligo: Secondary | ICD-10-CM | POA: Diagnosis not present

## 2020-10-30 DIAGNOSIS — Z1212 Encounter for screening for malignant neoplasm of rectum: Secondary | ICD-10-CM | POA: Diagnosis not present

## 2020-10-30 DIAGNOSIS — R82998 Other abnormal findings in urine: Secondary | ICD-10-CM | POA: Diagnosis not present

## 2020-11-05 ENCOUNTER — Inpatient Hospital Stay (HOSPITAL_BASED_OUTPATIENT_CLINIC_OR_DEPARTMENT_OTHER): Payer: Medicare HMO | Admitting: Hematology & Oncology

## 2020-11-05 ENCOUNTER — Other Ambulatory Visit: Payer: Self-pay

## 2020-11-05 ENCOUNTER — Telehealth: Payer: Self-pay

## 2020-11-05 ENCOUNTER — Encounter: Payer: Self-pay | Admitting: Hematology & Oncology

## 2020-11-05 ENCOUNTER — Inpatient Hospital Stay: Payer: Medicare HMO | Attending: Hematology & Oncology

## 2020-11-05 VITALS — BP 168/66 | HR 52 | Temp 98.5°F | Resp 19 | Wt 136.0 lb

## 2020-11-05 DIAGNOSIS — H9190 Unspecified hearing loss, unspecified ear: Secondary | ICD-10-CM | POA: Insufficient documentation

## 2020-11-05 DIAGNOSIS — E7800 Pure hypercholesterolemia, unspecified: Secondary | ICD-10-CM | POA: Insufficient documentation

## 2020-11-05 DIAGNOSIS — Z79899 Other long term (current) drug therapy: Secondary | ICD-10-CM

## 2020-11-05 DIAGNOSIS — Z85820 Personal history of malignant melanoma of skin: Secondary | ICD-10-CM | POA: Insufficient documentation

## 2020-11-05 DIAGNOSIS — R519 Headache, unspecified: Secondary | ICD-10-CM | POA: Insufficient documentation

## 2020-11-05 DIAGNOSIS — E559 Vitamin D deficiency, unspecified: Secondary | ICD-10-CM | POA: Insufficient documentation

## 2020-11-05 DIAGNOSIS — R030 Elevated blood-pressure reading, without diagnosis of hypertension: Secondary | ICD-10-CM | POA: Insufficient documentation

## 2020-11-05 DIAGNOSIS — I10 Essential (primary) hypertension: Secondary | ICD-10-CM | POA: Insufficient documentation

## 2020-11-05 DIAGNOSIS — M3500 Sicca syndrome, unspecified: Secondary | ICD-10-CM | POA: Insufficient documentation

## 2020-11-05 DIAGNOSIS — B070 Plantar wart: Secondary | ICD-10-CM | POA: Insufficient documentation

## 2020-11-05 DIAGNOSIS — C439 Malignant melanoma of skin, unspecified: Secondary | ICD-10-CM

## 2020-11-05 DIAGNOSIS — R229 Localized swelling, mass and lump, unspecified: Secondary | ICD-10-CM | POA: Insufficient documentation

## 2020-11-05 DIAGNOSIS — L932 Other local lupus erythematosus: Secondary | ICD-10-CM | POA: Insufficient documentation

## 2020-11-05 DIAGNOSIS — R350 Frequency of micturition: Secondary | ICD-10-CM | POA: Insufficient documentation

## 2020-11-05 DIAGNOSIS — I491 Atrial premature depolarization: Secondary | ICD-10-CM | POA: Insufficient documentation

## 2020-11-05 LAB — CMP (CANCER CENTER ONLY)
ALT: 18 U/L (ref 0–44)
AST: 23 U/L (ref 15–41)
Albumin: 3.9 g/dL (ref 3.5–5.0)
Alkaline Phosphatase: 80 U/L (ref 38–126)
Anion gap: 8 (ref 5–15)
BUN: 16 mg/dL (ref 8–23)
CO2: 27 mmol/L (ref 22–32)
Calcium: 8.7 mg/dL — ABNORMAL LOW (ref 8.9–10.3)
Chloride: 105 mmol/L (ref 98–111)
Creatinine: 0.8 mg/dL (ref 0.61–1.24)
GFR, Estimated: >60 mL/min (ref >60)
Glucose, Bld: 112 mg/dL — ABNORMAL HIGH (ref 70–99)
Potassium: 5 mmol/L (ref 3.5–5.1)
Sodium: 140 mmol/L (ref 135–145)
Total Bilirubin: 0.9 mg/dL (ref 0.3–1.2)
Total Protein: 7.3 g/dL (ref 6.5–8.1)

## 2020-11-05 LAB — CBC WITH DIFFERENTIAL (CANCER CENTER ONLY)
Abs Immature Granulocytes: 0.01 10*3/uL (ref 0.00–0.07)
Basophils Absolute: 0 10*3/uL (ref 0.0–0.1)
Basophils Relative: 1 %
Eosinophils Absolute: 0.2 10*3/uL (ref 0.0–0.5)
Eosinophils Relative: 3 %
HCT: 40.9 % (ref 39.0–52.0)
Hemoglobin: 13.4 g/dL (ref 13.0–17.0)
Immature Granulocytes: 0 %
Lymphocytes Relative: 23 %
Lymphs Abs: 1.2 10*3/uL (ref 0.7–4.0)
MCH: 31.7 pg (ref 26.0–34.0)
MCHC: 32.8 g/dL (ref 30.0–36.0)
MCV: 96.7 fL (ref 80.0–100.0)
Monocytes Absolute: 0.7 10*3/uL (ref 0.1–1.0)
Monocytes Relative: 12 %
Neutro Abs: 3.3 10*3/uL (ref 1.7–7.7)
Neutrophils Relative %: 61 %
Platelet Count: 197 10*3/uL (ref 150–400)
RBC: 4.23 MIL/uL (ref 4.22–5.81)
RDW: 12.6 % (ref 11.5–15.5)
WBC Count: 5.4 10*3/uL (ref 4.0–10.5)
nRBC: 0 % (ref 0.0–0.2)

## 2020-11-05 LAB — LACTATE DEHYDROGENASE: LDH: 116 U/L (ref 98–192)

## 2020-11-05 NOTE — Progress Notes (Signed)
Referral MD  Reason for Referral: -Recurrent melanoma-BRAF positive-treated with ZELBORAF followed by Curt Bears in 2013  Chief Complaint  Patient presents with  . New Patient (Initial Visit)  : I moved down here and need an oncologist.  HPI: Johnny Oliver is a very nice 87 year old white male.  He is originally from the [place].  As such, we have something in, and as my mom's side of the family is from the [place].  He worked for the [employer].  He lived in the [place] area.  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  A daughter that he has is currently a pediatric resident at [facility].  She went to [employer].  He is very interesting to talk to.  His history dates back to 2007.  It sounds like he had a melanoma removed from the left upper back.  Then, in 2013, it recurred.  It sounds like he had systemic recurrence.  He was treated by Dr. Adonis Brook of [facility].  The melanoma was found to be BRAF positive.  He was treated with [facility] for about 6 months and then was treated with [facility] for about 3 months.  After that, he was found to be in remission.  He is apparently still in remission.  He has been getting PET scans.  He moved down to [place] a year ago.  He has been doing well.  He has a new doctor.  He sees Dr. Francesco Sor.  Dr. Francesco Sor referred him to the [facility] for follow-up.  He has had no complaints.  He certainly does not look his age.  He has been active.  He tries exercise.  He walks.  He has had no problems with cough or shortness of breath.  He has had no change in bowel or bladder habits.  He has not noted any skin nodules.  He has had no palpable lymph nodes.  He has had a little somewhat weight loss but this seems to be stabilizing.  There has been no headache.  He has had no blurred vision.  He has had no dysphagia or odynophagia.  He has not noted any bleeding with the bowels.  Overall, I would  say his performance status is ECOG 1.   History reviewed. No pertinent past medical history.:  History reviewed. No pertinent surgical history.:   Current Outpatient Medications:  .  ramipril (ALTACE) 2.5 MG capsule, Take 2.5 mg by mouth daily., Disp: , Rfl:  .  cefdinir (OMNICEF) 300 MG capsule, Take 300 mg by mouth., Disp: , Rfl:  .  cyclobenzaprine (FLEXERIL) 10 MG tablet, Take 10 mg by mouth at bedtime as needed., Disp: , Rfl:  .  diclofenac (VOLTAREN) 50 MG EC tablet, Take 50 mg by mouth., Disp: , Rfl:  .  ibuprofen (ADVIL) 600 MG tablet, Take 600 mg by mouth 2 (two) times daily as needed., Disp: , Rfl:  .  phenazopyridine (PYRIDIUM) 200 MG tablet, Take 200 mg by mouth., Disp: , Rfl:  .  triamcinolone (KENALOG) 0.1 %, triamcinolone acetonide 0.1 % topical cream, Disp: , Rfl: :  :  Not on File:  History reviewed. No pertinent family history.:  Social History   Socioeconomic History  . Marital status: Married    Spouse name: Not on file  . Number of children: Not on file  . Years of education: Not on file  . Highest education level: Not on file  Occupational History  . Not on  file  Tobacco Use  . Smoking status: Not on file  . Smokeless tobacco: Not on file  Substance and Sexual Activity  . Alcohol use: Not on file  . Drug use: Not on file  . Sexual activity: Not on file  Other Topics Concern  . Not on file  Social History Narrative  . Not on file   Social Determinants of Health   Financial Resource Strain: Not on file  Food Insecurity: Not on file  Transportation Needs: Not on file  Physical Activity: Not on file  Stress: Not on file  Social Connections: Not on file  Intimate Partner Violence: Not on file  :  Review of Systems  Constitutional: Negative.   HENT: Negative.   Eyes: Negative.   Respiratory: Negative.   Cardiovascular: Negative.   Gastrointestinal: Negative.   Genitourinary: Negative.   Musculoskeletal: Negative.   Skin: Negative.    Neurological: Negative.   Endo/Heme/Allergies: Negative.   Psychiatric/Behavioral: Negative.      Exam:   This is a well-developed and well-nourished white gentleman in no obvious distress.  Vital signs show temperature of 98.5.  Pulse 52.  Blood pressure 168/66.  Weight is 136 pounds.  Head and neck exam shows no ocular or oral lesions.  There are no palpable cervical or supraclavicular lymph nodes.  Thyroid is nonpalpable.  Lungs are clear to percussion and auscultation bilaterally.  Cardiac exam regular rate and rhythm with no murmurs, rubs or bruits.  Abdomen is soft.  He has good bowel sounds.  There is no fluid wave.  There is no palpable liver or spleen tip.  Back exam shows the wide local excision scar in the left upper back.  This is well-healed.  There is no tenderness over the spine, ribs or hips.  Extremities shows no clubbing, cyanosis or edema.  He has good range of motion of his joints.  He has good strength in upper and lower extremities.  Skin exam shows no rashes, ecchymoses or petechia.  Neurological exam shows no focal neurological deficits.   '@IPVITALS'$ @   Recent Labs    11/05/20 1105  WBC 5.4  HGB 13.4  HCT 40.9  PLT 197   Recent Labs    11/05/20 1105  NA 140  K 5.0  CL 105  CO2 27  GLUCOSE 112*  BUN 16  CREATININE 0.80  CALCIUM 8.7*    Blood smear review: None  Pathology: None    Assessment and Plan: Mr. Salts is incredibly interesting 87 year old white male.  He is originally from the [place].  It was fun talking to him about the [place].  We certainly had quite a bit and comment.  It was also fun talking to him about [place].  He has a history of recurrent melanoma.  I would have to say that after 7-8 years, that he certainly has a good chance of being cured.  This definitely does still happen with respect to utilizizing our new targeted drugs and immunotherapy.  However, there is certainly would be a risk of recurrence.  I would  worry about the risk of CNS recurrence.  I told him what to watch out for with respect to neurological issues.  I just do not see that we have to do any scans on him right now.  I do not have back all of our lab work yet.  If we see some abnormalities with his lab work, then we may have to consider some radiologic studies.  He certainly  is incredibly intelligent.  He knows what to watch out for.  I reviewed with him symptoms that he may have that might suggest recurrent melanoma.  I told him if anything is unusual, that he can always give [place] a call.  I spent a good hour with him.  Again he is incredibly interesting to talk to.  It is certainly a pleasure and privilege to try to be able to help him.  Hopefully we will never have to treat him.  We will get him back in 1 year.

## 2020-11-05 NOTE — Telephone Encounter (Signed)
One year appts made for pt per 11/05/20 los, per pt he will view on mychart     aom

## 2021-01-06 DIAGNOSIS — H25813 Combined forms of age-related cataract, bilateral: Secondary | ICD-10-CM | POA: Diagnosis not present

## 2021-03-07 DIAGNOSIS — H25811 Combined forms of age-related cataract, right eye: Secondary | ICD-10-CM | POA: Diagnosis not present

## 2021-03-31 DIAGNOSIS — Z85820 Personal history of malignant melanoma of skin: Secondary | ICD-10-CM | POA: Diagnosis not present

## 2021-03-31 DIAGNOSIS — H9190 Unspecified hearing loss, unspecified ear: Secondary | ICD-10-CM | POA: Diagnosis not present

## 2021-03-31 DIAGNOSIS — I10 Essential (primary) hypertension: Secondary | ICD-10-CM | POA: Diagnosis not present

## 2021-03-31 DIAGNOSIS — G2581 Restless legs syndrome: Secondary | ICD-10-CM | POA: Diagnosis not present

## 2021-03-31 DIAGNOSIS — D692 Other nonthrombocytopenic purpura: Secondary | ICD-10-CM | POA: Diagnosis not present

## 2021-03-31 DIAGNOSIS — R1310 Dysphagia, unspecified: Secondary | ICD-10-CM | POA: Diagnosis not present

## 2021-03-31 DIAGNOSIS — Z0000 Encounter for general adult medical examination without abnormal findings: Secondary | ICD-10-CM | POA: Diagnosis not present

## 2021-03-31 DIAGNOSIS — M7062 Trochanteric bursitis, left hip: Secondary | ICD-10-CM | POA: Diagnosis not present

## 2021-04-15 DIAGNOSIS — H2512 Age-related nuclear cataract, left eye: Secondary | ICD-10-CM | POA: Diagnosis not present

## 2021-04-18 DIAGNOSIS — H25812 Combined forms of age-related cataract, left eye: Secondary | ICD-10-CM | POA: Diagnosis not present

## 2021-04-18 DIAGNOSIS — H2512 Age-related nuclear cataract, left eye: Secondary | ICD-10-CM | POA: Diagnosis not present

## 2021-05-12 ENCOUNTER — Encounter (HOSPITAL_COMMUNITY): Payer: Self-pay | Admitting: Emergency Medicine

## 2021-05-12 ENCOUNTER — Other Ambulatory Visit: Payer: Self-pay

## 2021-05-12 ENCOUNTER — Emergency Department (HOSPITAL_COMMUNITY)
Admission: EM | Admit: 2021-05-12 | Discharge: 2021-05-13 | Disposition: A | Discharge status: Home / Self Care | Payer: Medicare HMO | Attending: Emergency Medicine | Admitting: Emergency Medicine

## 2021-05-12 DIAGNOSIS — I10 Essential (primary) hypertension: Secondary | ICD-10-CM | POA: Insufficient documentation

## 2021-05-12 DIAGNOSIS — R55 Syncope and collapse: Secondary | ICD-10-CM

## 2021-05-12 DIAGNOSIS — S0081XA Abrasion of other part of head, initial encounter: Secondary | ICD-10-CM | POA: Diagnosis not present

## 2021-05-12 DIAGNOSIS — Z043 Encounter for examination and observation following other accident: Secondary | ICD-10-CM | POA: Diagnosis not present

## 2021-05-12 DIAGNOSIS — S0990XA Unspecified injury of head, initial encounter: Secondary | ICD-10-CM

## 2021-05-12 DIAGNOSIS — Z85820 Personal history of malignant melanoma of skin: Secondary | ICD-10-CM | POA: Insufficient documentation

## 2021-05-12 DIAGNOSIS — Z79899 Other long term (current) drug therapy: Secondary | ICD-10-CM | POA: Insufficient documentation

## 2021-05-12 DIAGNOSIS — M47812 Spondylosis without myelopathy or radiculopathy, cervical region: Secondary | ICD-10-CM | POA: Diagnosis not present

## 2021-05-12 DIAGNOSIS — W19XXXA Unspecified fall, initial encounter: Secondary | ICD-10-CM | POA: Diagnosis not present

## 2021-05-12 DIAGNOSIS — M545 Low back pain, unspecified: Secondary | ICD-10-CM | POA: Diagnosis not present

## 2021-05-12 NOTE — ED Triage Notes (Addendum)
Patient reports brief syncopal episode this evening at home , alert and oriented at arrival , presents with skin abrasions at right upper forehead and nose / emesis prior to arrival. Denies pain/respirations unlbalored. Patient stated dehydration due to low fluid intake this week .

## 2021-05-13 ENCOUNTER — Emergency Department (HOSPITAL_COMMUNITY): Payer: Medicare HMO

## 2021-05-13 DIAGNOSIS — R55 Syncope and collapse: Secondary | ICD-10-CM | POA: Diagnosis not present

## 2021-05-13 DIAGNOSIS — M47812 Spondylosis without myelopathy or radiculopathy, cervical region: Secondary | ICD-10-CM | POA: Diagnosis not present

## 2021-05-13 DIAGNOSIS — Z043 Encounter for examination and observation following other accident: Secondary | ICD-10-CM | POA: Diagnosis not present

## 2021-05-13 DIAGNOSIS — M545 Low back pain, unspecified: Secondary | ICD-10-CM | POA: Diagnosis not present

## 2021-05-13 DIAGNOSIS — S0990XA Unspecified injury of head, initial encounter: Secondary | ICD-10-CM | POA: Diagnosis not present

## 2021-05-13 DIAGNOSIS — S0081XA Abrasion of other part of head, initial encounter: Secondary | ICD-10-CM | POA: Diagnosis not present

## 2021-05-13 LAB — URINALYSIS, ROUTINE W REFLEX MICROSCOPIC
Bilirubin Urine: NEGATIVE
Glucose, UA: NEGATIVE mg/dL
Hgb urine dipstick: NEGATIVE
Ketones, ur: 5 mg/dL — AB
Leukocytes,Ua: NEGATIVE
Nitrite: NEGATIVE
Protein, ur: NEGATIVE mg/dL
Specific Gravity, Urine: 1.023 (ref 1.005–1.030)
pH: 5 (ref 5.0–8.0)

## 2021-05-13 LAB — CBC WITH DIFFERENTIAL/PLATELET
Abs Immature Granulocytes: 0.05 10*3/uL (ref 0.00–0.07)
Basophils Absolute: 0 10*3/uL (ref 0.0–0.1)
Basophils Relative: 0 %
Eosinophils Absolute: 0 10*3/uL (ref 0.0–0.5)
Eosinophils Relative: 0 %
HCT: 38.5 % — ABNORMAL LOW (ref 39.0–52.0)
Hemoglobin: 12.5 g/dL — ABNORMAL LOW (ref 13.0–17.0)
Immature Granulocytes: 1 %
Lymphocytes Relative: 6 %
Lymphs Abs: 0.6 10*3/uL — ABNORMAL LOW (ref 0.7–4.0)
MCH: 31.7 pg (ref 26.0–34.0)
MCHC: 32.5 g/dL (ref 30.0–36.0)
MCV: 97.7 fL (ref 80.0–100.0)
Monocytes Absolute: 0.9 10*3/uL (ref 0.1–1.0)
Monocytes Relative: 8 %
Neutro Abs: 8.9 10*3/uL — ABNORMAL HIGH (ref 1.7–7.7)
Neutrophils Relative %: 85 %
Platelets: 200 10*3/uL (ref 150–400)
RBC: 3.94 MIL/uL — ABNORMAL LOW (ref 4.22–5.81)
RDW: 12.7 % (ref 11.5–15.5)
WBC: 10.5 10*3/uL (ref 4.0–10.5)
nRBC: 0 % (ref 0.0–0.2)

## 2021-05-13 LAB — COMPREHENSIVE METABOLIC PANEL
ALT: 31 U/L (ref 0–44)
AST: 38 U/L (ref 15–41)
Albumin: 4 g/dL (ref 3.5–5.0)
Alkaline Phosphatase: 106 U/L (ref 38–126)
Anion gap: 9 (ref 5–15)
BUN: 18 mg/dL (ref 8–23)
CO2: 26 mmol/L (ref 22–32)
Calcium: 8.4 mg/dL — ABNORMAL LOW (ref 8.9–10.3)
Chloride: 99 mmol/L (ref 98–111)
Creatinine, Ser: 0.76 mg/dL (ref 0.61–1.24)
GFR, Estimated: >60 mL/min (ref >60)
Glucose, Bld: 158 mg/dL — ABNORMAL HIGH (ref 70–99)
Potassium: 3.9 mmol/L (ref 3.5–5.1)
Sodium: 134 mmol/L — ABNORMAL LOW (ref 135–145)
Total Bilirubin: 0.9 mg/dL (ref 0.3–1.2)
Total Protein: 7.5 g/dL (ref 6.5–8.1)

## 2021-05-13 LAB — LIPASE, BLOOD: Lipase: 30 U/L (ref 11–51)

## 2021-05-13 LAB — TROPONIN I (HIGH SENSITIVITY)
Troponin I (High Sensitivity): 5 ng/L (ref <18)
Troponin I (High Sensitivity): 6 ng/L (ref <18)

## 2021-05-13 LAB — CK: Total CK: 293 U/L (ref 49–397)

## 2021-05-13 NOTE — ED Notes (Signed)
Pt was able to ambulate to room.

## 2021-05-13 NOTE — ED Provider Notes (Signed)
Emergency Medicine Provider Triage Evaluation Note  Johnny Oliver , a 87 y.o. male  was evaluated in triage.  Pt complains of acute episode of syncope around 9pm.  No prodrome.  Pt reports he was outside in the heat today and did not drink much water.  Denies blood thinners.  Did hit his head.  [place] just before coming. Unknown last tetanus. NBNB emesis since the syncope.  No chest pain.  Denies neck pain.  Does have ,low back pain  Review of Systems  Positive: Syncope, abrasions, vomiting (many times) Negative: Headache, CP  Physical Exam  BP (!) 147/76 (BP Location: Right Arm)   Pulse 73   Temp 98.7 F (37.1 C) (Oral)   Resp 16   SpO2 100%  Gen:   Awake, no distress, abrasions to the forehead and nose Resp:  Normal effort  MSK:   Moves extremities without difficulty  Other:  Neuro exam grossly nonfocal  Medical Decision Making  Medically screening exam initiated at 12:02 AM.  Appropriate orders placed.  Johnny Oliver was informed that the remainder of the evaluation will be completed by another provider, this initial triage assessment does not replace that evaluation, and the importance of remaining in the ED until their evaluation is complete.  Syncope - labs, imaging, ECG pending.   Johnny Oliver, Gwenlyn Perking 05/13/21 0005    Mesner, Corene Cornea, MD 05/13/21 [phone]

## 2021-05-13 NOTE — ED Notes (Signed)
PT left AMA 

## 2021-05-13 NOTE — Discharge Instructions (Addendum)
Please read and follow all provided instructions.  Your diagnoses today include:  1. Syncope, unspecified syncope type   2. Injury of head, initial encounter   3. Facial abrasion, initial encounter     Tests performed today include: CT scan of your head and neck that did not show any serious injury. Blood cell counts (white, red, and platelets) Electrolytes  Kidney function test Urine test to check for infection EKG Vital signs. See below for your results today.   Medications prescribed:  None  Take any prescribed medications only as directed.  Home care instructions:  Follow any educational materials contained in this packet.  BE VERY CAREFUL not to take multiple medicines containing Tylenol (also called acetaminophen). Doing so can lead to an overdose which can damage your liver and cause liver failure and possibly death.   Follow-up instructions: Please follow-up with your primary care provider in the next 2 days for further evaluation of your symptoms.   Return instructions:  SEEK IMMEDIATE MEDICAL ATTENTION IF: There is confusion or drowsiness (although children frequently become drowsy after injury).  You cannot awaken the injured person.  You have more than one episode of vomiting.  You notice dizziness or unsteadiness which is getting worse, or inability to walk.  You have convulsions or unconsciousness.  You experience severe, persistent headaches not relieved by Tylenol. You cannot use arms or legs normally.  There are changes in pupil sizes. (This is the black center in the colored part of the eye)  There is clear or bloody discharge from the nose or ears.  You have change in speech, vision, swallowing, or understanding.  Localized weakness, numbness, tingling, or change in bowel or bladder control. You have any other emergent concerns.  Additional Information: You have had a head injury which does not appear to require admission at this time.  Your vital  signs today were: BP (!) 151/83   Pulse 68   Temp 98.1 F (36.7 C)   Resp 13   Ht 5\' 8"  (1.727 m)   Wt 61.2 kg   SpO2 100%   BMI 20.53 kg/m  If your blood pressure (BP) was elevated above 135/85 this visit, please have this repeated by your doctor within one month. --------------

## 2021-06-04 NOTE — ED Provider Notes (Signed)
[facility] EMERGENCY DEPARTMENT Provider Note   CSN: [id number] Arrival date & time: 05/12/21  2333     History Chief Complaint  Patient presents with   Syncope    Johnny Oliver is a 87 y.o. male.  Pt with h/o HTN and no h/o CHF or anticoagulation, presents to the ED for syncopal spell, fall. He vomited afterwards. No HA, CP, SOB, confusion. Admits to working in heat with recent poor fluid intake.       History reviewed. No pertinent past medical history.  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   Benign essential hypertension 11/05/2020   Sjogren's syndrome ([place]) 11/05/2020   Personal history of malignant melanoma of skin 08/29/2019   HTN (hypertension) 08/29/2019   Pruritus ani 08/24/2019   Bony pelvic pain 08/24/2019   Cough 05/26/2019   Chest pain 05/26/2019   Unintentional weight loss 04/10/2019   Pain in right foot 01/20/2019   Diverticulosis of colon without diverticulitis 10/24/2018   Cholelithiasis without obstruction 10/24/2018   Vasovagal syncope 04/08/2018   Restless legs 04/08/2018   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 No pertinent surgical history.     No family history on file.  Social History   Tobacco Use   Smoking status: Never  Substance Use Topics   Alcohol use: Never   Drug  use: Never    Home Medications Prior to Admission medications   Medication Sig Start Date End Date Taking? Authorizing Provider  cefdinir (OMNICEF) 300 MG capsule Take 300 mg by mouth.    [provider]  cyclobenzaprine (FLEXERIL) 10 MG tablet Take 10 mg by mouth at bedtime as needed.    [provider]  diclofenac (VOLTAREN) 50 MG EC tablet Take 50 mg by mouth.    [provider]  ibuprofen (ADVIL) 600 MG tablet Take 600 mg by mouth 2 (two) times daily as needed.    [provider]  phenazopyridine (PYRIDIUM) 200 MG tablet Take 200 mg by mouth.    [provider]  ramipril (ALTACE) 2.5 MG capsule Take 2.5 mg by mouth daily. 02/23/18   [provider]  triamcinolone (KENALOG) 0.1 % triamcinolone acetonide 0.1 % topical cream    [provider]    Allergies    Patient has no known allergies.  Review of Systems   Review of Systems  Constitutional:  Negative for appetite change.  Cardiovascular:  Negative for chest pain.  Gastrointestinal:  Positive for nausea and vomiting.  Musculoskeletal:  Positive for back pain. Negative for neck pain.  Neurological:  Positive for syncope and headaches.   Physical Exam Updated Vital Signs BP 140/75   Pulse 76   Temp 98.4 F (36.9 C)   Resp 16   Ht '5\' 8"'$  (1.727 m)   Wt 61.2 kg   SpO2 98%   BMI 20.53 kg/m   Physical Exam Vitals and nursing note reviewed.  Constitutional:  Appearance: He is well-developed.  HENT:     Head: Normocephalic.     Comments: Facial abrasions Eyes:     Conjunctiva/sclera: Conjunctivae normal.  Cardiovascular:     Rate and Rhythm: Normal rate.  Pulmonary:     Effort: No respiratory distress.  Musculoskeletal:     Cervical back: Normal range of motion and neck supple.  Skin:    General: Skin is warm and dry.  Neurological:     Mental Status: He is alert.    ED Results / Procedures / Treatments   Labs (all labs ordered are listed, but  only abnormal results are displayed) Labs Reviewed  CBC WITH DIFFERENTIAL/PLATELET - Abnormal; Notable for the following components:      Result Value   RBC 3.94 (*)    Hemoglobin 12.5 (*)    HCT 38.5 (*)    Neutro Abs 8.9 (*)    Lymphs Abs 0.6 (*)    All other components within normal limits  COMPREHENSIVE METABOLIC PANEL - Abnormal; Notable for the following components:   Sodium 134 (*)    Glucose, Bld 158 (*)    Calcium 8.4 (*)    All other components within normal limits  URINALYSIS, ROUTINE W REFLEX MICROSCOPIC - Abnormal; Notable for the following components:   Ketones, ur 5 (*)    All other components within normal limits  LIPASE, BLOOD  CK  TROPONIN I (HIGH SENSITIVITY)  TROPONIN I (HIGH SENSITIVITY)    EKG EKG Interpretation  Date/Time:  Tuesday May 13 2021 00:00:01 EDT Ventricular Rate:  78 PR Interval:  170 QRS Duration: 134 QT Interval:  412 QTC Calculation: 469 R Axis:   -52 Text Interpretation: Normal sinus rhythm Left axis deviation Right bundle branch block No old tracing to compare Confirmed by Sherwood Gambler [phone]) on 05/13/2021 12:33:00 PM  Radiology No results found.  Procedures Procedures   Medications Ordered in ED Medications - No data to display  ED Course  I have reviewed the triage vital signs and the nursing notes.  Pertinent labs & imaging results that were available during my care of the patient were reviewed by me and considered in my medical decision making (see chart for details).  Patient seen and examined. CT head/neck, lumbar spine films neg. EKG as noted without old for comparison. CBC with mild anemia, slightly lower than previous. High normal WBC with slight neutrophil predominance, unclear cause but patient does not appear septic. Slightly high blood glucose without ketosis, electrolytes okay. Trop 5 >> 6. CK normal and UA without abnormalities.   Plan d/c. Encouraged rest and good hydration. Patient urged to return with  worsening symptoms or other concerns. Patient verbalized understanding and agrees with plan.      MDM Rules/Calculators/A&P                           Syncope, reassuring work-up. No significant injury noted on imaging.  Final Clinical Impression(s) / ED Diagnoses Final diagnoses:  Syncope, unspecified syncope type  Injury of head, initial encounter  Facial abrasion, initial encounter    Rx / DC Orders ED Discharge Orders     None        Carlisle Cater, PA-C 06/06/21 [id number]    Sherwood Gambler, MD 06/06/21 1442

## 2021-07-23 DIAGNOSIS — Z20828 Contact with and (suspected) exposure to other viral communicable diseases: Secondary | ICD-10-CM | POA: Diagnosis not present

## 2021-07-23 DIAGNOSIS — Z20822 Contact with and (suspected) exposure to covid-19: Secondary | ICD-10-CM | POA: Diagnosis not present

## 2021-07-23 DIAGNOSIS — R5383 Other fatigue: Secondary | ICD-10-CM | POA: Diagnosis not present

## 2021-07-23 DIAGNOSIS — R051 Acute cough: Secondary | ICD-10-CM | POA: Diagnosis not present

## 2021-07-23 DIAGNOSIS — R0981 Nasal congestion: Secondary | ICD-10-CM | POA: Diagnosis not present

## 2021-10-14 DIAGNOSIS — D2272 Melanocytic nevi of left lower limb, including hip: Secondary | ICD-10-CM | POA: Diagnosis not present

## 2021-10-14 DIAGNOSIS — L821 Other seborrheic keratosis: Secondary | ICD-10-CM | POA: Diagnosis not present

## 2021-10-14 DIAGNOSIS — D1801 Hemangioma of skin and subcutaneous tissue: Secondary | ICD-10-CM | POA: Diagnosis not present

## 2021-10-14 DIAGNOSIS — L812 Freckles: Secondary | ICD-10-CM | POA: Diagnosis not present

## 2021-10-14 DIAGNOSIS — D2262 Melanocytic nevi of left upper limb, including shoulder: Secondary | ICD-10-CM | POA: Diagnosis not present

## 2021-10-14 DIAGNOSIS — D225 Melanocytic nevi of trunk: Secondary | ICD-10-CM | POA: Diagnosis not present

## 2021-10-14 DIAGNOSIS — Z85820 Personal history of malignant melanoma of skin: Secondary | ICD-10-CM | POA: Diagnosis not present

## 2021-10-14 DIAGNOSIS — D2271 Melanocytic nevi of right lower limb, including hip: Secondary | ICD-10-CM | POA: Diagnosis not present

## 2021-10-22 DIAGNOSIS — E611 Iron deficiency: Secondary | ICD-10-CM | POA: Diagnosis not present

## 2021-10-22 DIAGNOSIS — I10 Essential (primary) hypertension: Secondary | ICD-10-CM | POA: Diagnosis not present

## 2021-10-23 DIAGNOSIS — Z125 Encounter for screening for malignant neoplasm of prostate: Secondary | ICD-10-CM | POA: Diagnosis not present

## 2021-10-23 DIAGNOSIS — I10 Essential (primary) hypertension: Secondary | ICD-10-CM | POA: Diagnosis not present

## 2021-10-29 DIAGNOSIS — D509 Iron deficiency anemia, unspecified: Secondary | ICD-10-CM | POA: Diagnosis not present

## 2021-10-29 DIAGNOSIS — D692 Other nonthrombocytopenic purpura: Secondary | ICD-10-CM | POA: Diagnosis not present

## 2021-10-29 DIAGNOSIS — Z85820 Personal history of malignant melanoma of skin: Secondary | ICD-10-CM | POA: Diagnosis not present

## 2021-10-29 DIAGNOSIS — I10 Essential (primary) hypertension: Secondary | ICD-10-CM | POA: Diagnosis not present

## 2021-10-29 DIAGNOSIS — Z23 Encounter for immunization: Secondary | ICD-10-CM | POA: Diagnosis not present

## 2021-10-29 DIAGNOSIS — Z1331 Encounter for screening for depression: Secondary | ICD-10-CM | POA: Diagnosis not present

## 2021-10-29 DIAGNOSIS — E611 Iron deficiency: Secondary | ICD-10-CM | POA: Diagnosis not present

## 2021-10-29 DIAGNOSIS — M7062 Trochanteric bursitis, left hip: Secondary | ICD-10-CM | POA: Diagnosis not present

## 2021-10-29 DIAGNOSIS — R1310 Dysphagia, unspecified: Secondary | ICD-10-CM | POA: Diagnosis not present

## 2021-10-29 DIAGNOSIS — Z0000 Encounter for general adult medical examination without abnormal findings: Secondary | ICD-10-CM | POA: Diagnosis not present

## 2021-10-29 DIAGNOSIS — R82998 Other abnormal findings in urine: Secondary | ICD-10-CM | POA: Diagnosis not present

## 2021-11-05 ENCOUNTER — Telehealth: Payer: Self-pay | Admitting: *Deleted

## 2021-11-05 ENCOUNTER — Inpatient Hospital Stay: Payer: Medicare HMO | Attending: Hematology & Oncology

## 2021-11-05 ENCOUNTER — Other Ambulatory Visit: Payer: Self-pay

## 2021-11-05 ENCOUNTER — Inpatient Hospital Stay: Payer: Medicare HMO | Admitting: Hematology & Oncology

## 2021-11-05 ENCOUNTER — Encounter: Payer: Self-pay | Admitting: Hematology & Oncology

## 2021-11-05 VITALS — BP 132/65 | HR 62 | Temp 97.8°F | Resp 16 | Wt 131.1 lb

## 2021-11-05 DIAGNOSIS — R634 Abnormal weight loss: Secondary | ICD-10-CM | POA: Diagnosis not present

## 2021-11-05 DIAGNOSIS — C439 Malignant melanoma of skin, unspecified: Secondary | ICD-10-CM

## 2021-11-05 DIAGNOSIS — Z85820 Personal history of malignant melanoma of skin: Secondary | ICD-10-CM | POA: Diagnosis not present

## 2021-11-05 DIAGNOSIS — C4320 Malignant melanoma of unspecified ear and external auricular canal: Secondary | ICD-10-CM

## 2021-11-05 LAB — CMP (CANCER CENTER ONLY)
ALT: 25 U/L (ref 0–44)
AST: 24 U/L (ref 15–41)
Albumin: 4.2 g/dL (ref 3.5–5.0)
Alkaline Phosphatase: 94 U/L (ref 38–126)
Anion gap: 5 (ref 5–15)
BUN: 14 mg/dL (ref 8–23)
CO2: 30 mmol/L (ref 22–32)
Calcium: 9.2 mg/dL (ref 8.9–10.3)
Chloride: 105 mmol/L (ref 98–111)
Creatinine: 0.73 mg/dL (ref 0.61–1.24)
GFR, Estimated: >60 mL/min (ref >60)
Glucose, Bld: 92 mg/dL (ref 70–99)
Potassium: 4.5 mmol/L (ref 3.5–5.1)
Sodium: 140 mmol/L (ref 135–145)
Total Bilirubin: 0.6 mg/dL (ref 0.3–1.2)
Total Protein: 6.9 g/dL (ref 6.5–8.1)

## 2021-11-05 LAB — LACTATE DEHYDROGENASE: LDH: 144 U/L (ref 98–192)

## 2021-11-05 LAB — CBC WITH DIFFERENTIAL (CANCER CENTER ONLY)
Abs Immature Granulocytes: 0.02 10*3/uL (ref 0.00–0.07)
Basophils Absolute: 0.1 10*3/uL (ref 0.0–0.1)
Basophils Relative: 1 %
Eosinophils Absolute: 0.4 10*3/uL (ref 0.0–0.5)
Eosinophils Relative: 6 %
HCT: 39.6 % (ref 39.0–52.0)
Hemoglobin: 13 g/dL (ref 13.0–17.0)
Immature Granulocytes: 0 %
Lymphocytes Relative: 21 %
Lymphs Abs: 1.3 10*3/uL (ref 0.7–4.0)
MCH: 31.7 pg (ref 26.0–34.0)
MCHC: 32.8 g/dL (ref 30.0–36.0)
MCV: 96.6 fL (ref 80.0–100.0)
Monocytes Absolute: 0.8 10*3/uL (ref 0.1–1.0)
Monocytes Relative: 13 %
Neutro Abs: 3.6 10*3/uL (ref 1.7–7.7)
Neutrophils Relative %: 59 %
Platelet Count: 230 10*3/uL (ref 150–400)
RBC: 4.1 MIL/uL — ABNORMAL LOW (ref 4.22–5.81)
RDW: 12.8 % (ref 11.5–15.5)
WBC Count: 6.1 10*3/uL (ref 4.0–10.5)
nRBC: 0 % (ref 0.0–0.2)

## 2021-11-05 NOTE — Telephone Encounter (Signed)
Per 08/26/22 los - gave upcoming appointments - confirmed 

## 2021-11-05 NOTE — Progress Notes (Signed)
Hematology and Oncology Follow Up Visit  Terrin Meddaugh [id number] 03-05-1934 88 y.o. 11/05/2021   Principle Diagnosis:  Recurrent melanoma-treated in 2013-BRAF positive  Current Therapy:   ZELBORAF/Yervoy-completed in 2013     Interim History:  Mr. Crumble is back for follow-up.  This is his second office visit.  We saw him a year ago.  Since we last saw him, he has been doing pretty well.  My only concern is that he is losing weight.  When we saw him a year ago he weighed 136 pounds.  Before this, he weighed about 140 pounds.  Today he weighed about 131 pounds.  I do worry about the possibility of melanoma recurring.  I know this can recur after about 10 years.  He is having some swallowing difficulties.  He is going to see ENT for this.  I talked to him about doing CT scans.  He would like to try to hold off on these for right now.  He is not hurting.  He is working.  Is exercising.  He is having no problems with bowels or bladder.  He went over the [place] in September.  He came back with COVID.  He was not hospitalized for this.  He has had no rashes.  He has had no diarrhea.  There is been no headache.  He has had no bleeding.  Overall, his performance status is probably ECOG 1.  Medications:  Current Outpatient Medications:    Iron, Ferrous Sulfate, 325 (65 Fe) MG TABS, Take 1 tablet by mouth daily at 6 (six) AM., Disp: , Rfl:    ramipril (ALTACE) 2.5 MG capsule, Take 2.5 mg by mouth daily., Disp: , Rfl:    cefdinir (OMNICEF) 300 MG capsule, Take 300 mg by mouth. (Patient not taking: Reported on 11/05/2021), Disp: , Rfl:    cyclobenzaprine (FLEXERIL) 10 MG tablet, Take 10 mg by mouth at bedtime as needed. (Patient not taking: Reported on 11/05/2021), Disp: , Rfl:    diclofenac (VOLTAREN) 50 MG EC tablet, Take 50 mg by mouth. (Patient not taking: Reported on 11/05/2021), Disp: , Rfl:    ibuprofen (ADVIL) 600 MG tablet, Take 600 mg by mouth 2 (two) times daily as needed. (Patient not  taking: Reported on 11/05/2021), Disp: , Rfl:    phenazopyridine (PYRIDIUM) 200 MG tablet, Take 200 mg by mouth. (Patient not taking: Reported on 11/05/2021), Disp: , Rfl:    triamcinolone (KENALOG) 0.1 %, triamcinolone acetonide 0.1 % topical cream (Patient not taking: Reported on 11/05/2021), Disp: , Rfl:   Allergies: No Known Allergies  Past Medical History, Surgical history, Social history, and Family History were reviewed and updated.  Review of Systems: Review of Systems  Constitutional:  Positive for unexpected weight change.  HENT:  Negative.    Eyes: Negative.   Respiratory: Negative.    Cardiovascular: Negative.   Gastrointestinal: Negative.   Endocrine: Negative.   Genitourinary: Negative.    Musculoskeletal: Negative.   Skin: Negative.   Neurological: Negative.   Hematological: Negative.   Psychiatric/Behavioral: Negative.     Physical Exam:  weight is 131 lb 1.9 oz (59.5 kg). His oral temperature is 97.8 F (36.6 C). His blood pressure is 132/65 and his pulse is 62. His respiration is 16 and oxygen saturation is 100%.   Wt Readings from Last 3 Encounters:  11/05/21 131 lb 1.9 oz (59.5 kg)  05/13/21 135 lb (61.2 kg)  11/05/20 136 lb (61.7 kg)    Physical Exam Vitals reviewed.  HENT:  Head: Normocephalic and atraumatic.  Eyes:     Pupils: Pupils are equal, round, and reactive to light.  Cardiovascular:     Rate and Rhythm: Normal rate and regular rhythm.     Heart sounds: Normal heart sounds.  Pulmonary:     Effort: Pulmonary effort is normal.     Breath sounds: Normal breath sounds.  Abdominal:     General: Bowel sounds are normal.     Palpations: Abdomen is soft.  Musculoskeletal:        General: No tenderness or deformity. Normal range of motion.     Cervical back: Normal range of motion.  Lymphadenopathy:     Cervical: No cervical adenopathy.  Skin:    General: Skin is warm and dry.     Findings: No erythema or rash.  Neurological:     Mental  Status: He is alert and oriented to person, place, and time.  Psychiatric:        Behavior: Behavior normal.        Thought Content: Thought content normal.        Judgment: Judgment normal.     Lab Results  Component Value Date   WBC 6.1 11/05/2021   HGB 13.0 11/05/2021   HCT 39.6 11/05/2021   MCV 96.6 11/05/2021   PLT 230 11/05/2021     Chemistry      Component Value Date/Time   NA 140 11/05/2021 0941   K 4.5 11/05/2021 0941   CL 105 11/05/2021 0941   CO2 30 11/05/2021 0941   BUN 14 11/05/2021 0941   CREATININE 0.73 11/05/2021 0941      Component Value Date/Time   CALCIUM 9.2 11/05/2021 0941   ALKPHOS 94 11/05/2021 0941   AST 24 11/05/2021 0941   ALT 25 11/05/2021 0941   BILITOT 0.6 11/05/2021 0941       Impression and Plan: Mr. Goodrich is a very nice 88 year old male.  He is [place].  He had a history of melanoma 10 years ago.  He had this resected.  He had recurrence.  Recurrence appear to be local.  He was treated with targeted therapy because of the BRAF mutation.  He also got immunotherapy with Yervoy.  We will have to monitor his weight loss closely.  If he continues to lose weight, then I think we can have to see about doing CAT scans on him to make sure nothing is going on that we cannot palpate.  He is okay with this.  He wants to see how his appointment goes with ENT and to see if the swallowing can be helped.  I will plan to see him back in about 4 months.  I want to make sure that we get him back a lot sooner than 1 year.   Volanda Napoleon, MD 1/11/202311:28 AM

## 2021-11-27 DIAGNOSIS — H43812 Vitreous degeneration, left eye: Secondary | ICD-10-CM | POA: Diagnosis not present

## 2021-11-27 DIAGNOSIS — Z961 Presence of intraocular lens: Secondary | ICD-10-CM | POA: Diagnosis not present

## 2021-11-27 DIAGNOSIS — H26492 Other secondary cataract, left eye: Secondary | ICD-10-CM | POA: Diagnosis not present

## 2022-03-27 ENCOUNTER — Inpatient Hospital Stay: Payer: Medicare HMO | Attending: Hematology & Oncology

## 2022-03-27 ENCOUNTER — Encounter: Payer: Self-pay | Admitting: Hematology & Oncology

## 2022-03-27 ENCOUNTER — Inpatient Hospital Stay: Payer: Medicare HMO | Admitting: Hematology & Oncology

## 2022-03-27 VITALS — BP 137/70 | HR 60 | Temp 97.5°F | Resp 16 | Wt 133.0 lb

## 2022-03-27 DIAGNOSIS — Z79899 Other long term (current) drug therapy: Secondary | ICD-10-CM | POA: Diagnosis not present

## 2022-03-27 DIAGNOSIS — R1310 Dysphagia, unspecified: Secondary | ICD-10-CM | POA: Insufficient documentation

## 2022-03-27 DIAGNOSIS — Z85820 Personal history of malignant melanoma of skin: Secondary | ICD-10-CM | POA: Insufficient documentation

## 2022-03-27 DIAGNOSIS — C439 Malignant melanoma of skin, unspecified: Secondary | ICD-10-CM | POA: Diagnosis not present

## 2022-03-27 DIAGNOSIS — C4320 Malignant melanoma of unspecified ear and external auricular canal: Secondary | ICD-10-CM

## 2022-03-27 LAB — CMP (CANCER CENTER ONLY)
ALT: 19 U/L (ref 0–44)
AST: 19 U/L (ref 15–41)
Albumin: 4.1 g/dL (ref 3.5–5.0)
Alkaline Phosphatase: 70 U/L (ref 38–126)
Anion gap: 5 (ref 5–15)
BUN: 19 mg/dL (ref 8–23)
CO2: 30 mmol/L (ref 22–32)
Calcium: 8.9 mg/dL (ref 8.9–10.3)
Chloride: 105 mmol/L (ref 98–111)
Creatinine: 0.76 mg/dL (ref 0.61–1.24)
GFR, Estimated: >60 mL/min (ref >60)
Glucose, Bld: 95 mg/dL (ref 70–99)
Potassium: 4.8 mmol/L (ref 3.5–5.1)
Sodium: 140 mmol/L (ref 135–145)
Total Bilirubin: 0.6 mg/dL (ref 0.3–1.2)
Total Protein: 6.9 g/dL (ref 6.5–8.1)

## 2022-03-27 LAB — CBC WITH DIFFERENTIAL (CANCER CENTER ONLY)
Abs Immature Granulocytes: 0.01 10*3/uL (ref 0.00–0.07)
Basophils Absolute: 0.1 10*3/uL (ref 0.0–0.1)
Basophils Relative: 1 %
Eosinophils Absolute: 0.3 10*3/uL (ref 0.0–0.5)
Eosinophils Relative: 5 %
HCT: 38.9 % — ABNORMAL LOW (ref 39.0–52.0)
Hemoglobin: 13 g/dL (ref 13.0–17.0)
Immature Granulocytes: 0 %
Lymphocytes Relative: 20 %
Lymphs Abs: 1.1 10*3/uL (ref 0.7–4.0)
MCH: 32 pg (ref 26.0–34.0)
MCHC: 33.4 g/dL (ref 30.0–36.0)
MCV: 95.8 fL (ref 80.0–100.0)
Monocytes Absolute: 0.6 10*3/uL (ref 0.1–1.0)
Monocytes Relative: 11 %
Neutro Abs: 3.5 10*3/uL (ref 1.7–7.7)
Neutrophils Relative %: 63 %
Platelet Count: 186 10*3/uL (ref 150–400)
RBC: 4.06 MIL/uL — ABNORMAL LOW (ref 4.22–5.81)
RDW: 12.4 % (ref 11.5–15.5)
WBC Count: 5.6 10*3/uL (ref 4.0–10.5)
nRBC: 0 % (ref 0.0–0.2)

## 2022-03-27 LAB — LACTATE DEHYDROGENASE: LDH: 122 U/L (ref 98–192)

## 2022-03-27 NOTE — Progress Notes (Signed)
Hematology and Oncology Follow Up Visit  Fontaine Hehl [id number] Apr 16, 1934 88 y.o. 03/27/2022   Principle Diagnosis:  Recurrent melanoma-treated in 2013-BRAF positive  Current Therapy:   ZELBORAF/Yervoy-completed in 2013     Interim History:  Mr. Petrich is back for follow-up.  He is doing better.  His weight is holding steady right now.  He says eating pretty good.  He has little bit of dysphagia.  He is going to go see ENT for this.  The big news is that he is going back to [place] for a couple weeks.  I am sure that he will have a wonderful time [place] the [place].  He has had no change in bowel or bladder habits.  He has had no cough or shortness of breath.  He has had no headache.  He has had no rashes.  There is been no bleeding.  Overall, I would say his performance status is ECOG 1.     Medications:  Current Outpatient Medications:    Iron, Ferrous Sulfate, 325 (65 Fe) MG TABS, Take 1 tablet by mouth daily at 6 (six) AM., Disp: , Rfl:    ramipril (ALTACE) 2.5 MG capsule, Take 2.5 mg by mouth daily., Disp: , Rfl:   Allergies: No Known Allergies  Past Medical History, Surgical history, Social history, and Family History were reviewed and updated.  Review of Systems: Review of Systems  Constitutional:  Positive for unexpected weight change.  HENT:  Negative.    Eyes: Negative.   Respiratory: Negative.    Cardiovascular: Negative.   Gastrointestinal: Negative.   Endocrine: Negative.   Genitourinary: Negative.    Musculoskeletal: Negative.   Skin: Negative.   Neurological: Negative.   Hematological: Negative.   Psychiatric/Behavioral: Negative.     Physical Exam:  weight is 133 lb (60.3 kg). His oral temperature is 97.5 F (36.4 C) (abnormal). His blood pressure is 137/70 and his pulse is 60. His respiration is 16 and oxygen saturation is 100%.   Wt Readings from Last 3 Encounters:  03/27/22 133 lb (60.3 kg)  11/05/21 131 lb 1.9 oz (59.5 kg)   05/13/21 135 lb (61.2 kg)    Physical Exam Vitals reviewed.  HENT:     Head: Normocephalic and atraumatic.  Eyes:     Pupils: Pupils are equal, round, and reactive to light.  Cardiovascular:     Rate and Rhythm: Normal rate and regular rhythm.     Heart sounds: Normal heart sounds.  Pulmonary:     Effort: Pulmonary effort is normal.     Breath sounds: Normal breath sounds.  Abdominal:     General: Bowel sounds are normal.     Palpations: Abdomen is soft.  Musculoskeletal:        General: No tenderness or deformity. Normal range of motion.     Cervical back: Normal range of motion.  Lymphadenopathy:     Cervical: No cervical adenopathy.  Skin:    General: Skin is warm and dry.     Findings: No erythema or rash.  Neurological:     Mental Status: He is alert and oriented to person, place, and time.  Psychiatric:        Behavior: Behavior normal.        Thought Content: Thought content normal.        Judgment: Judgment normal.     Lab Results  Component Value Date   WBC 5.6 03/27/2022   HGB 13.0 03/27/2022   HCT 38.9 (L) 03/27/2022  MCV 95.8 03/27/2022   PLT 186 03/27/2022     Chemistry      Component Value Date/Time   NA 140 03/27/2022 0854   K 4.8 03/27/2022 0854   CL 105 03/27/2022 0854   CO2 30 03/27/2022 0854   BUN 19 03/27/2022 0854   CREATININE 0.76 03/27/2022 0854      Component Value Date/Time   CALCIUM 8.9 03/27/2022 0854   ALKPHOS 70 03/27/2022 0854   AST 19 03/27/2022 0854   ALT 19 03/27/2022 0854   BILITOT 0.6 03/27/2022 0854       Impression and Plan: Mr. Trapp is a very nice 88 year old male.  He is [place].  He had a history of melanoma 10 years ago.  He had this resected.  He had recurrence.  Recurrence appear to be local.  He was treated with targeted therapy because of the BRAF mutation.  He also got immunotherapy with Yervoy.  I am glad that everything is holding steady for him.  He looks good.  He feels good.  At this point, we  can probably get him back in 1 year.  I think this would be very reasonable.  I would think that at some point, we hopefully can let him go from the clinic if he continues to do stabilize.    Volanda Napoleon, MD 6/2/20239:54 AM

## 2022-04-17 DIAGNOSIS — R1310 Dysphagia, unspecified: Secondary | ICD-10-CM | POA: Diagnosis not present

## 2022-04-17 DIAGNOSIS — K219 Gastro-esophageal reflux disease without esophagitis: Secondary | ICD-10-CM | POA: Diagnosis not present

## 2022-04-17 DIAGNOSIS — Z79899 Other long term (current) drug therapy: Secondary | ICD-10-CM | POA: Diagnosis not present

## 2022-06-04 DIAGNOSIS — Z85820 Personal history of malignant melanoma of skin: Secondary | ICD-10-CM | POA: Diagnosis not present

## 2022-06-04 DIAGNOSIS — M7062 Trochanteric bursitis, left hip: Secondary | ICD-10-CM | POA: Diagnosis not present

## 2022-06-04 DIAGNOSIS — R1310 Dysphagia, unspecified: Secondary | ICD-10-CM | POA: Diagnosis not present

## 2022-06-04 DIAGNOSIS — H9190 Unspecified hearing loss, unspecified ear: Secondary | ICD-10-CM | POA: Diagnosis not present

## 2022-06-04 DIAGNOSIS — I10 Essential (primary) hypertension: Secondary | ICD-10-CM | POA: Diagnosis not present

## 2022-06-04 DIAGNOSIS — G2581 Restless legs syndrome: Secondary | ICD-10-CM | POA: Diagnosis not present

## 2022-06-04 DIAGNOSIS — D692 Other nonthrombocytopenic purpura: Secondary | ICD-10-CM | POA: Diagnosis not present

## 2022-06-04 DIAGNOSIS — E611 Iron deficiency: Secondary | ICD-10-CM | POA: Diagnosis not present

## 2022-10-25 IMAGING — CT CT CERVICAL SPINE W/O CM
3 series · 12 of 33 positions shown, 14 images · non-contrast
Comparison: None.

CLINICAL DATA: Syncope, fall, head injury

EXAM:
CT HEAD WITHOUT CONTRAST
CT CERVICAL SPINE WITHOUT CONTRAST
TECHNIQUE: Multidetector CT imaging of the head and cervical spine was
performed following the standard protocol without intravenous
contrast. Multiplanar CT image reconstructions of the cervical spine
were also generated.

[Series 4: c_spine 2.0 (person_name) (person_name) · axial · 0.33mm/px · z∈[-233,-105]mm · 4 of 94 slices shown, 5 images]
[im 15/94  soft-tissue]
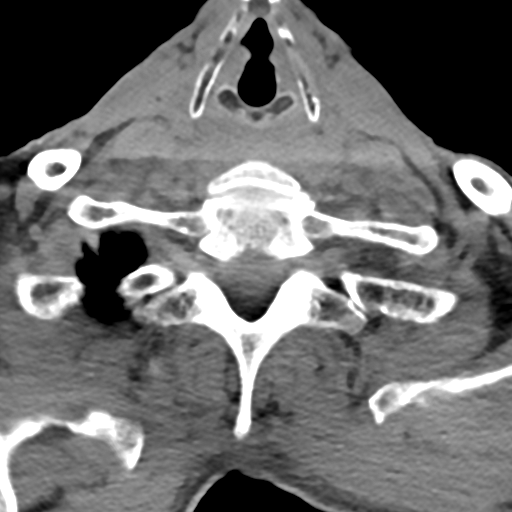
[im 15/94  bone]
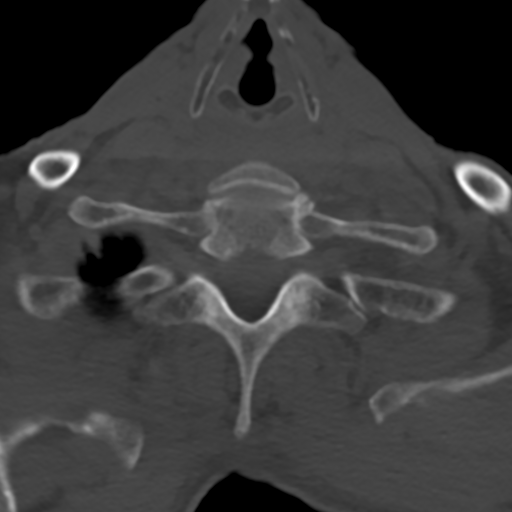
[im 36/94  bone]
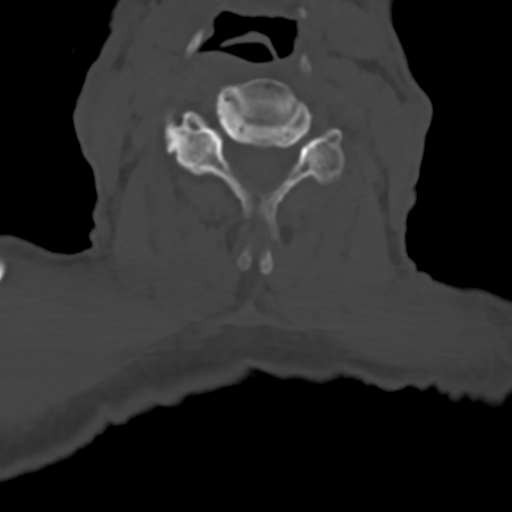
[im 58/94  bone]
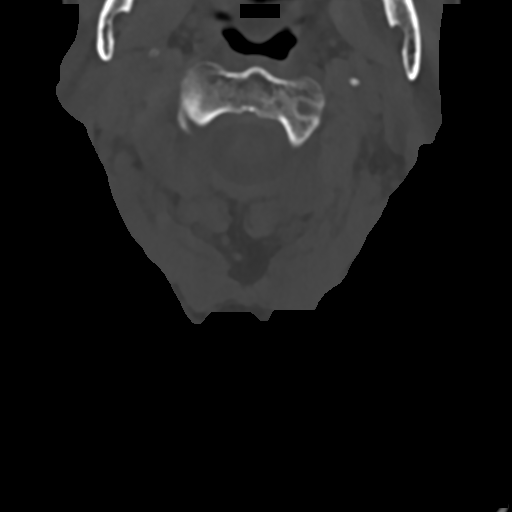
[im 79/94  bone]
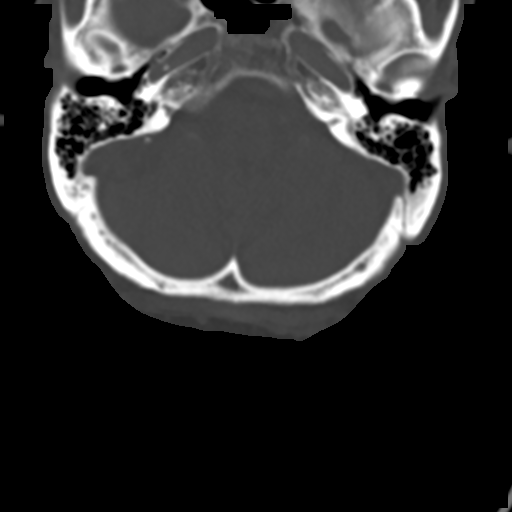

[Series 6: c_spine 2.0 sag bone · sagittal · 0.27mm/px · 5 of 67 slices shown, 6 images]
[im 23/67  bone]
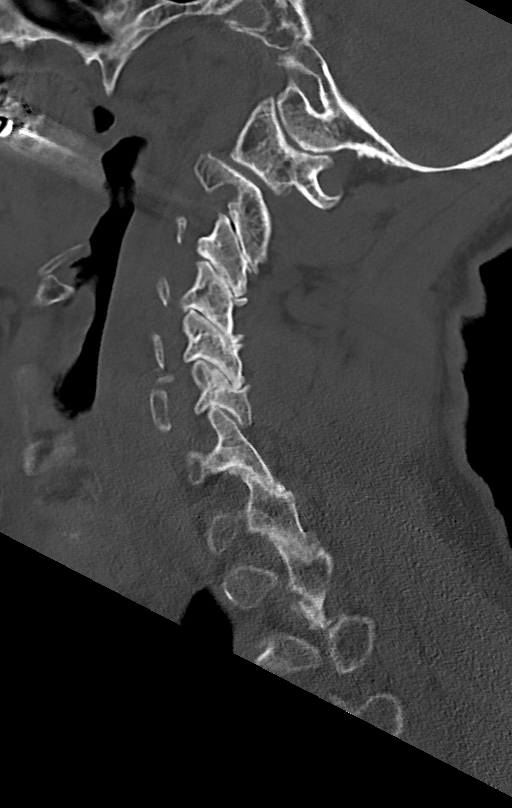
[im 28/67  bone]
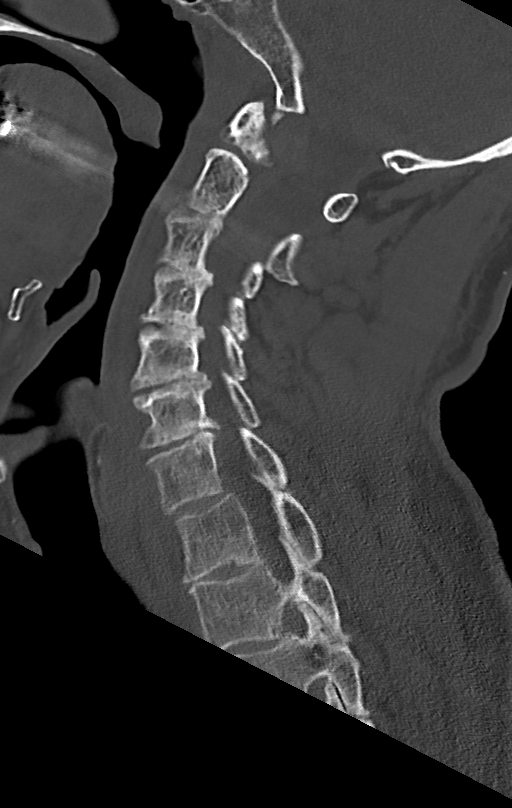
[im 34/67  soft-tissue]
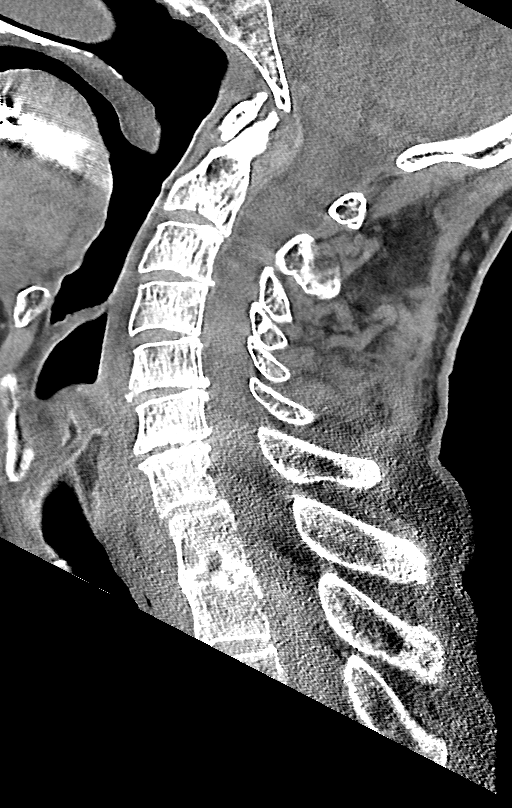
[im 34/67  bone]
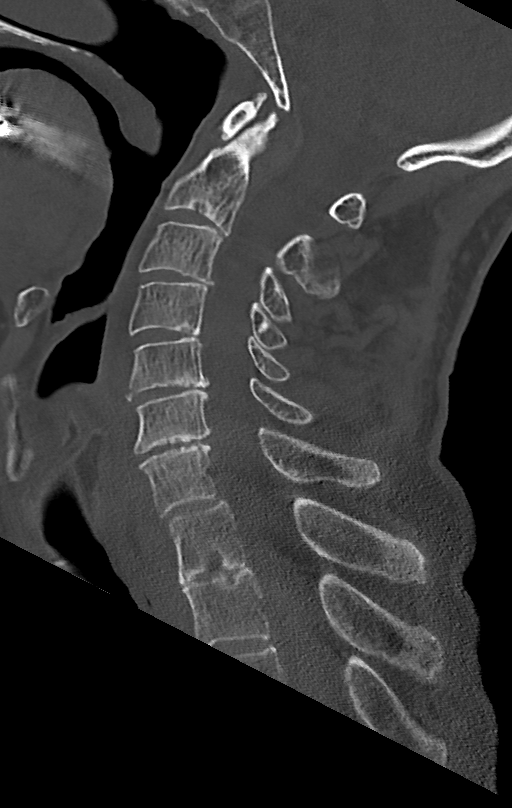
[im 39/67  bone]
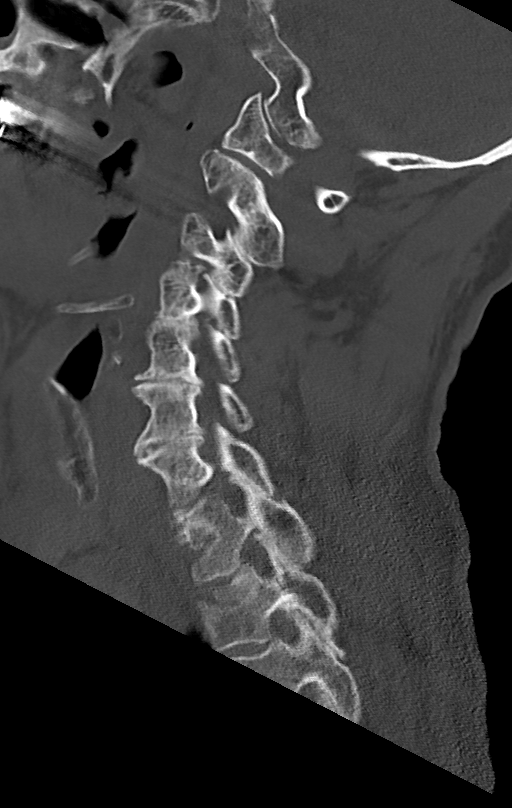
[im 45/67  bone]
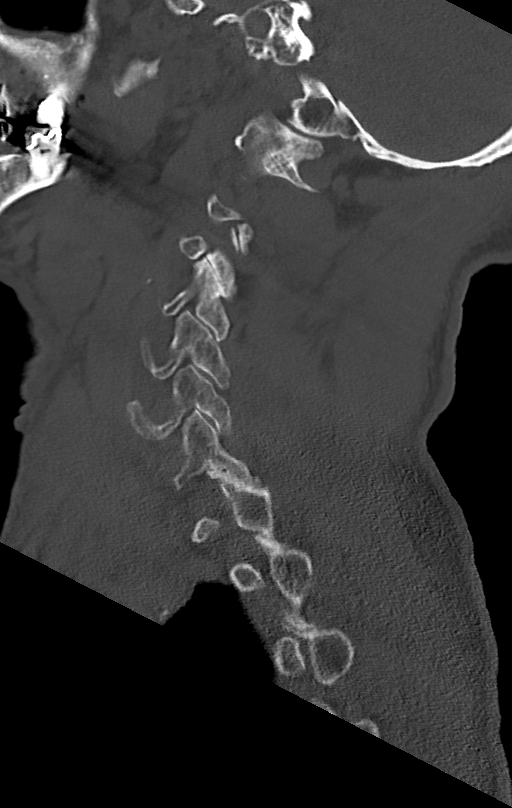

[Series 7: c_spine 2.0 cor bone · coronal · 0.27mm/px · 3 of 61 slices shown]
[im 14/61  bone]
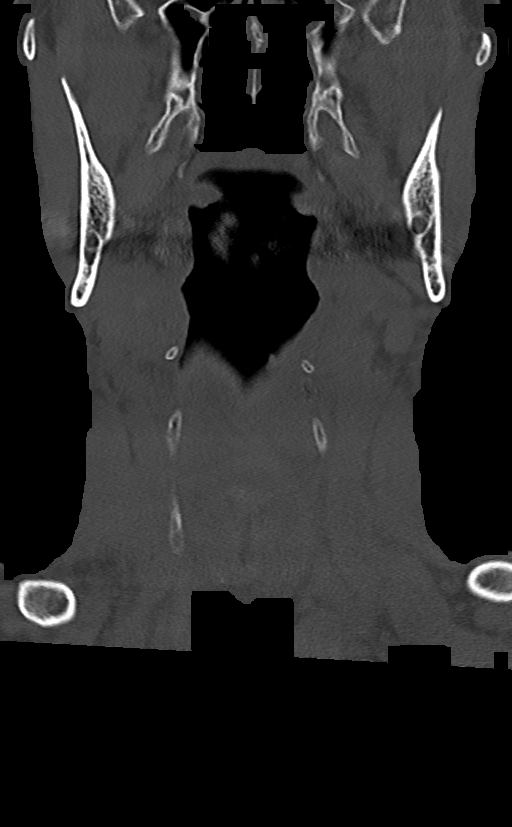
[im 25/61  bone]
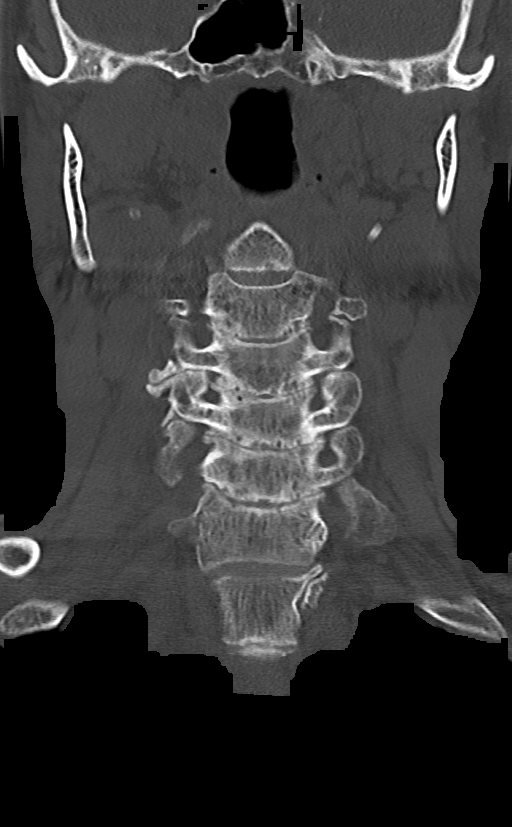
[im 36/61  bone]
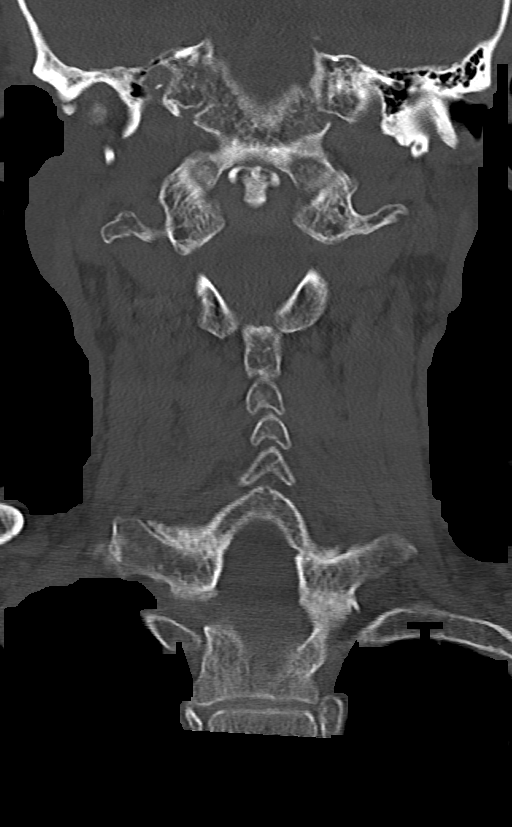

[12 of 33 positions shown; findings below may reference images not displayed]

FINDINGS: CT HEAD FINDINGS

Brain: Normal anatomic configuration. Parenchymal volume loss is
commensurate with the patient's age. Mild periventricular white
matter changes are present likely reflecting the sequela of small
vessel ischemia. No abnormal intra or extra-axial mass lesion or
fluid collection. No abnormal mass effect or midline shift. No
evidence of acute intracranial hemorrhage or infarct. Ventricular
size is normal. Cerebellum unremarkable.

Vascular: No asymmetric hyperdense vasculature at the skull base.

Skull: Intact

Sinuses/Orbits: There is mild mucosal thickening within the
maxillary sinuses bilaterally and opacification of several left
ethmoid air cells. No air-fluid levels. Remaining paranasal sinuses
are clear. Orbits are unremarkable.

Other: Mastoid air cells and middle ear cavities are clear.

CT CERVICAL SPINE FINDINGS

Alignment: Normal cervical lordosis.  No listhesis.

Skull base and vertebrae: The craniocervical alignment is normal.
Atlantal dental interval is not widened. No acute fracture of the
cervical spine. Vertebral body height has been preserved.

Soft tissues and spinal canal: No prevertebral fluid or swelling. No
visible canal hematoma.

Disc levels: There is intervertebral disc space narrowing and
endplate remodeling throughout the cervical spine, most severe at
C5-C7 in keeping with changes of mild to moderate degenerative disc
disease. Review of the axial images demonstrates multilevel
uncovertebral and facet arthrosis resulting in multilevel moderate
to severe neuroforaminal narrowing, most severe on the right at
C2-3, and bilaterally at C3-C7. The spinal canal is widely patent.

Upper chest: Unremarkable

Other: None
IMPRESSION: No acute intracranial abnormality.  No calvarial fracture.

No acute fracture or listhesis of the cervical spine.

## 2022-10-25 IMAGING — CT CT HEAD W/O CM
4 series · 16 of 47 positions shown, 18 images · non-contrast
Comparison: None.

CLINICAL DATA: Syncope, fall, head injury

EXAM:
CT HEAD WITHOUT CONTRAST
CT CERVICAL SPINE WITHOUT CONTRAST
TECHNIQUE: Multidetector CT imaging of the head and cervical spine was
performed following the standard protocol without intravenous
contrast. Multiplanar CT image reconstructions of the cervical spine
were also generated.

[Series 3: head without · axial · non-contrast · 0.44mm/px · z∈[-113,+7]mm · 7 of 34 slices shown, 9 images]
[im 5/34  brain]
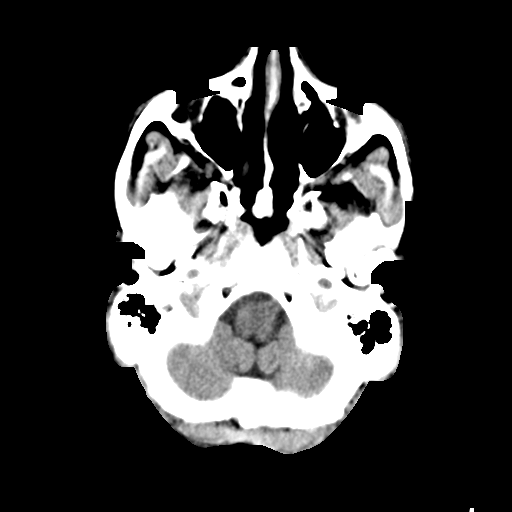
[im 5/34  bone]
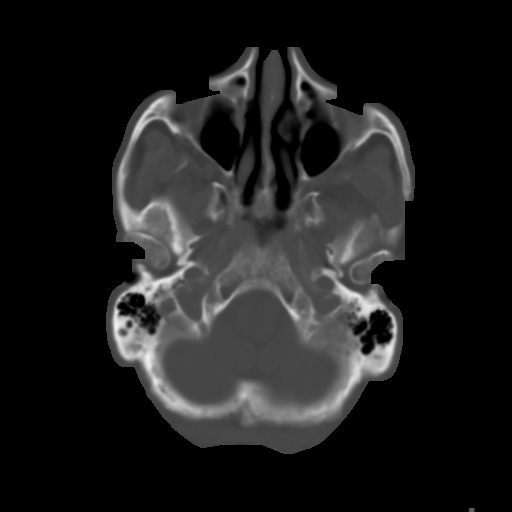
[im 9/34  brain]
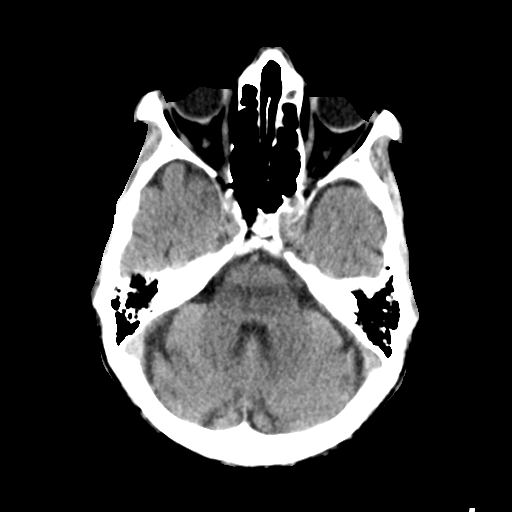
[im 13/34  brain]
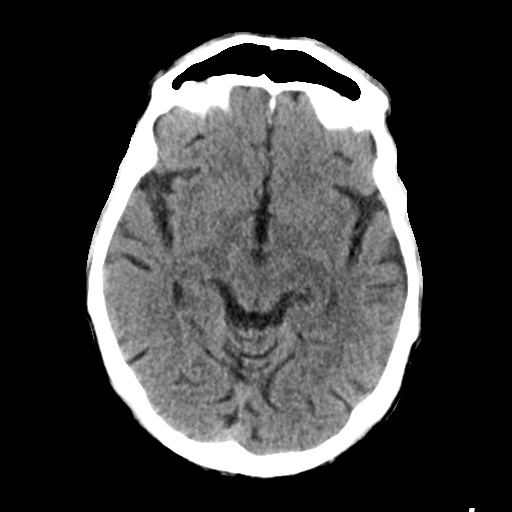
[im 17/34  brain]
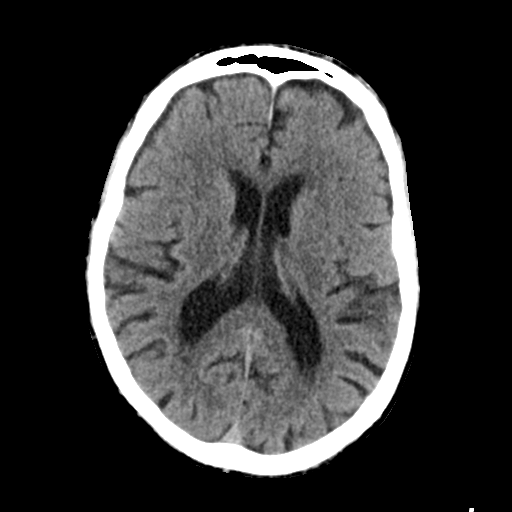
[im 21/34  brain]
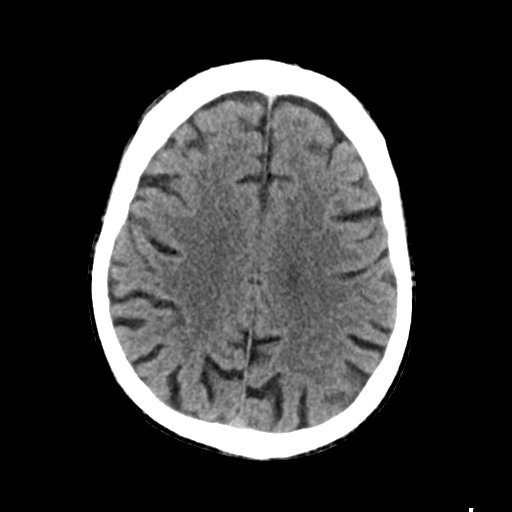
[im 21/34  bone]
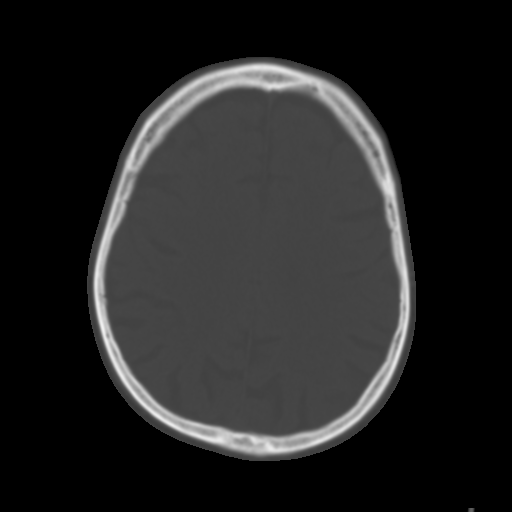
[im 25/34  brain]
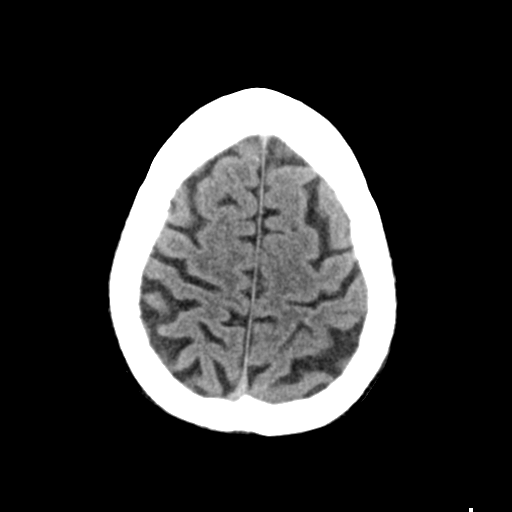
[im 29/34  brain]
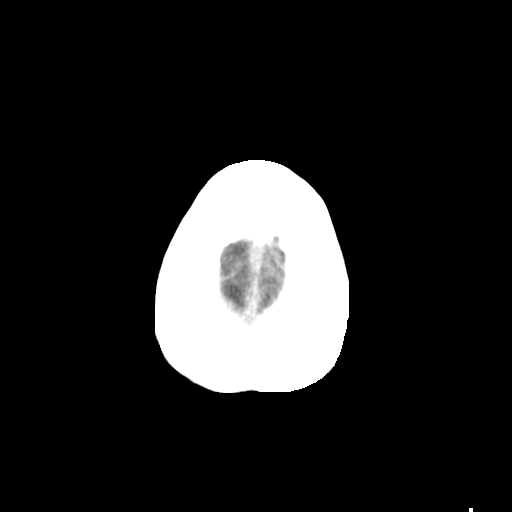

[Series 4: head bone · axial · 0.44mm/px · z∈[-117,-85]mm · 3 of 83 slices shown]
[im 9/83  bone]
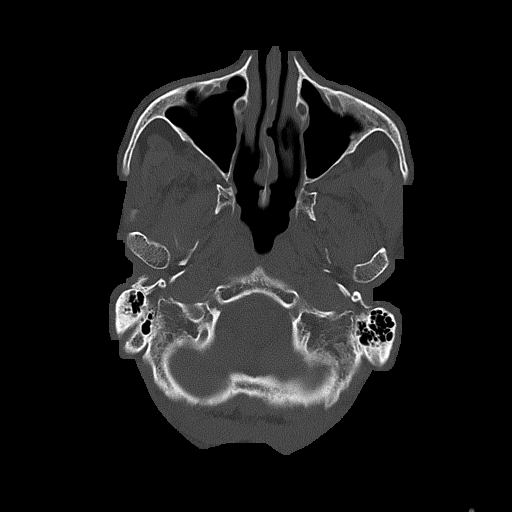
[im 17/83  bone]
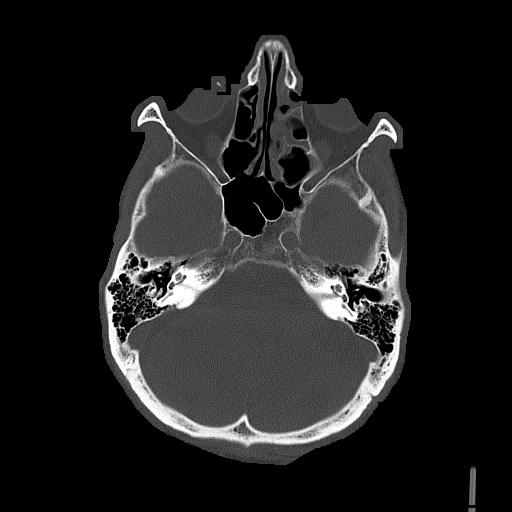
[im 25/83  bone]
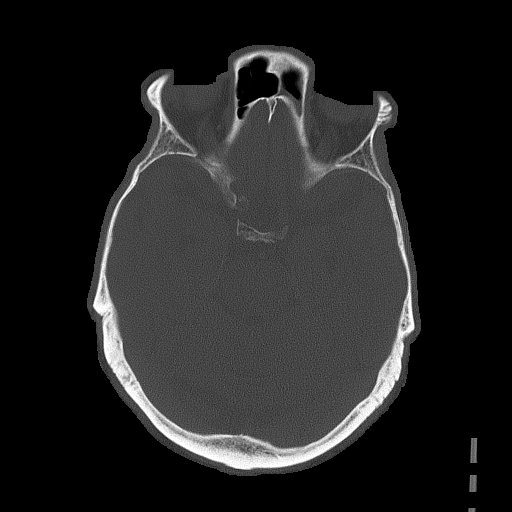

[Series 5: head without cor · coronal · non-contrast · 0.33mm/px · 3 of 65 slices shown]
[im 23/65  brain]
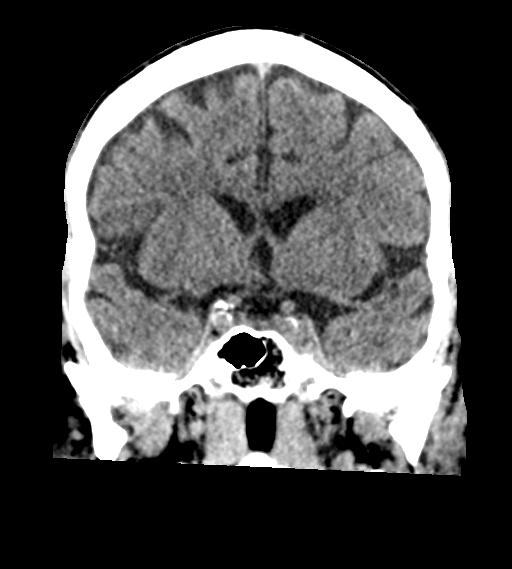
[im 29/65  brain]
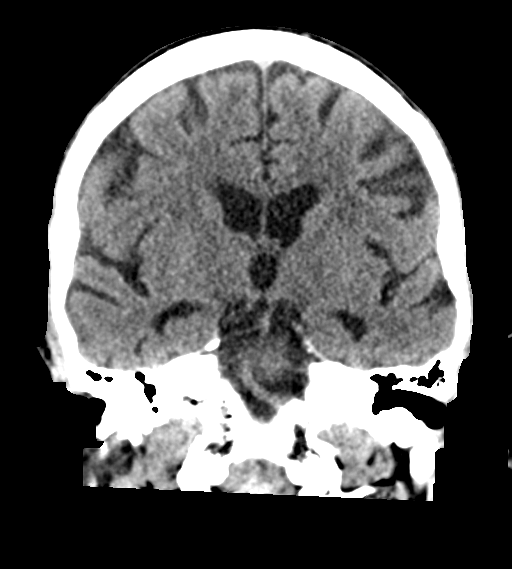
[im 35/65  brain]
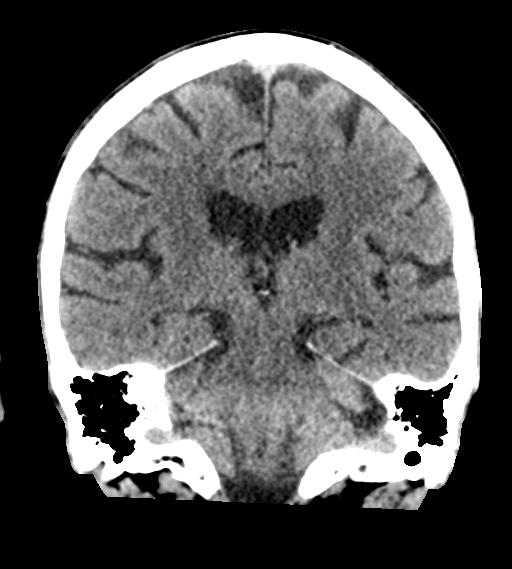

[Series 6: head without sag · sagittal · non-contrast · 0.35mm/px · 3 of 67 slices shown]
[im 23/67  brain]
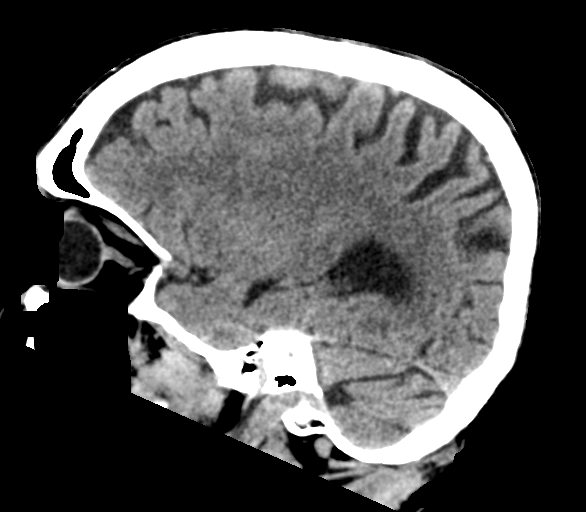
[im 34/67  brain]
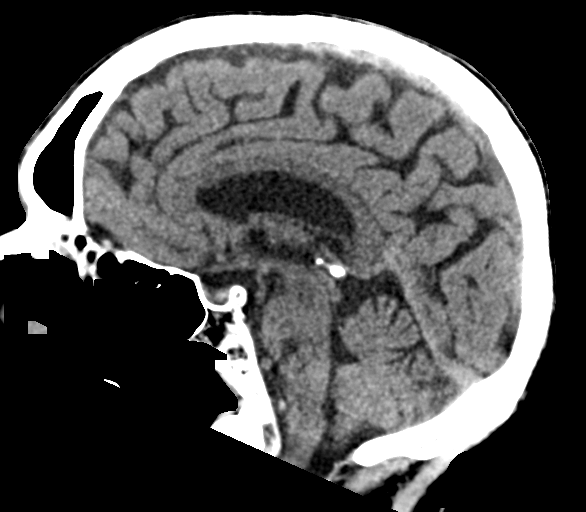
[im 45/67  brain]
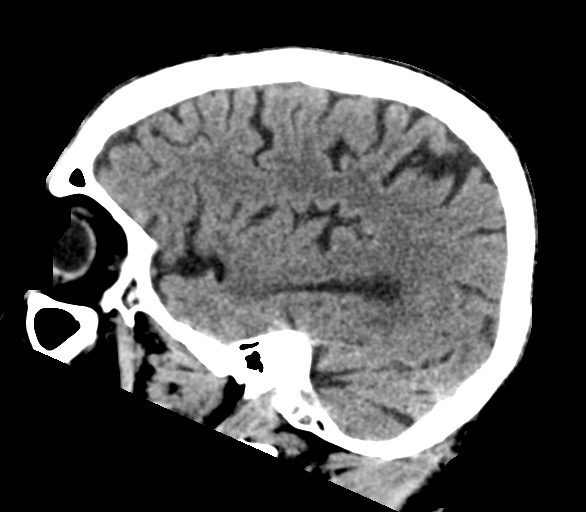

[16 of 47 positions shown; findings below may reference images not displayed]

FINDINGS: CT HEAD FINDINGS

Brain: Normal anatomic configuration. Parenchymal volume loss is
commensurate with the patient's age. Mild periventricular white
matter changes are present likely reflecting the sequela of small
vessel ischemia. No abnormal intra or extra-axial mass lesion or
fluid collection. No abnormal mass effect or midline shift. No
evidence of acute intracranial hemorrhage or infarct. Ventricular
size is normal. Cerebellum unremarkable.

Vascular: No asymmetric hyperdense vasculature at the skull base.

Skull: Intact

Sinuses/Orbits: There is mild mucosal thickening within the
maxillary sinuses bilaterally and opacification of several left
ethmoid air cells. No air-fluid levels. Remaining paranasal sinuses
are clear. Orbits are unremarkable.

Other: Mastoid air cells and middle ear cavities are clear.

CT CERVICAL SPINE FINDINGS

Alignment: Normal cervical lordosis.  No listhesis.

Skull base and vertebrae: The craniocervical alignment is normal.
Atlantal dental interval is not widened. No acute fracture of the
cervical spine. Vertebral body height has been preserved.

Soft tissues and spinal canal: No prevertebral fluid or swelling. No
visible canal hematoma.

Disc levels: There is intervertebral disc space narrowing and
endplate remodeling throughout the cervical spine, most severe at
C5-C7 in keeping with changes of mild to moderate degenerative disc
disease. Review of the axial images demonstrates multilevel
uncovertebral and facet arthrosis resulting in multilevel moderate
to severe neuroforaminal narrowing, most severe on the right at
C2-3, and bilaterally at C3-C7. The spinal canal is widely patent.

Upper chest: Unremarkable

Other: None
IMPRESSION: No acute intracranial abnormality.  No calvarial fracture.

No acute fracture or listhesis of the cervical spine.

## 2022-11-12 DIAGNOSIS — E611 Iron deficiency: Secondary | ICD-10-CM | POA: Diagnosis not present

## 2022-11-12 DIAGNOSIS — I10 Essential (primary) hypertension: Secondary | ICD-10-CM | POA: Diagnosis not present

## 2022-11-12 DIAGNOSIS — Z125 Encounter for screening for malignant neoplasm of prostate: Secondary | ICD-10-CM | POA: Diagnosis not present

## 2022-11-18 DIAGNOSIS — R1310 Dysphagia, unspecified: Secondary | ICD-10-CM | POA: Diagnosis not present

## 2022-11-18 DIAGNOSIS — Z8719 Personal history of other diseases of the digestive system: Secondary | ICD-10-CM | POA: Diagnosis not present

## 2022-11-19 DIAGNOSIS — M7062 Trochanteric bursitis, left hip: Secondary | ICD-10-CM | POA: Diagnosis not present

## 2022-11-19 DIAGNOSIS — Z1331 Encounter for screening for depression: Secondary | ICD-10-CM | POA: Diagnosis not present

## 2022-11-19 DIAGNOSIS — D692 Other nonthrombocytopenic purpura: Secondary | ICD-10-CM | POA: Diagnosis not present

## 2022-11-19 DIAGNOSIS — M545 Low back pain, unspecified: Secondary | ICD-10-CM | POA: Diagnosis not present

## 2022-11-19 DIAGNOSIS — R1310 Dysphagia, unspecified: Secondary | ICD-10-CM | POA: Diagnosis not present

## 2022-11-19 DIAGNOSIS — I10 Essential (primary) hypertension: Secondary | ICD-10-CM | POA: Diagnosis not present

## 2022-11-19 DIAGNOSIS — Z85820 Personal history of malignant melanoma of skin: Secondary | ICD-10-CM | POA: Diagnosis not present

## 2022-11-19 DIAGNOSIS — R82998 Other abnormal findings in urine: Secondary | ICD-10-CM | POA: Diagnosis not present

## 2022-11-19 DIAGNOSIS — Z23 Encounter for immunization: Secondary | ICD-10-CM | POA: Diagnosis not present

## 2022-11-19 DIAGNOSIS — C439 Malignant melanoma of skin, unspecified: Secondary | ICD-10-CM | POA: Diagnosis not present

## 2022-11-19 DIAGNOSIS — Z1339 Encounter for screening examination for other mental health and behavioral disorders: Secondary | ICD-10-CM | POA: Diagnosis not present

## 2022-11-19 DIAGNOSIS — Z0000 Encounter for general adult medical examination without abnormal findings: Secondary | ICD-10-CM | POA: Diagnosis not present

## 2023-04-01 ENCOUNTER — Other Ambulatory Visit: Payer: Medicare HMO

## 2023-04-01 ENCOUNTER — Ambulatory Visit: Payer: Medicare HMO | Admitting: Hematology & Oncology

## 2023-04-05 ENCOUNTER — Other Ambulatory Visit: Payer: Self-pay | Admitting: *Deleted

## 2023-04-05 DIAGNOSIS — C439 Malignant melanoma of skin, unspecified: Secondary | ICD-10-CM

## 2023-04-06 ENCOUNTER — Other Ambulatory Visit: Payer: Self-pay

## 2023-04-06 ENCOUNTER — Inpatient Hospital Stay: Payer: Medicare HMO | Admitting: Hematology & Oncology

## 2023-04-06 ENCOUNTER — Encounter: Payer: Self-pay | Admitting: Hematology & Oncology

## 2023-04-06 ENCOUNTER — Inpatient Hospital Stay: Payer: Medicare HMO | Attending: Hematology & Oncology

## 2023-04-06 VITALS — BP 112/68 | HR 57 | Temp 98.1°F | Resp 18 | Ht 68.0 in | Wt 131.0 lb

## 2023-04-06 DIAGNOSIS — C438 Malignant melanoma of overlapping sites of skin: Secondary | ICD-10-CM

## 2023-04-06 DIAGNOSIS — Z08 Encounter for follow-up examination after completed treatment for malignant neoplasm: Secondary | ICD-10-CM | POA: Diagnosis not present

## 2023-04-06 DIAGNOSIS — Z85820 Personal history of malignant melanoma of skin: Secondary | ICD-10-CM | POA: Diagnosis not present

## 2023-04-06 DIAGNOSIS — C439 Malignant melanoma of skin, unspecified: Secondary | ICD-10-CM

## 2023-04-06 LAB — COMPREHENSIVE METABOLIC PANEL
ALT: 14 U/L (ref 0–44)
AST: 17 U/L (ref 15–41)
Albumin: 4 g/dL (ref 3.5–5.0)
Alkaline Phosphatase: 66 U/L (ref 38–126)
Anion gap: 4 — ABNORMAL LOW (ref 5–15)
BUN: 16 mg/dL (ref 8–23)
CO2: 30 mmol/L (ref 22–32)
Calcium: 8.8 mg/dL — ABNORMAL LOW (ref 8.9–10.3)
Chloride: 106 mmol/L (ref 98–111)
Creatinine, Ser: 0.84 mg/dL (ref 0.61–1.24)
GFR, Estimated: >60 mL/min (ref >60)
Glucose, Bld: 106 mg/dL — ABNORMAL HIGH (ref 70–99)
Potassium: 4.7 mmol/L (ref 3.5–5.1)
Sodium: 140 mmol/L (ref 135–145)
Total Bilirubin: 0.7 mg/dL (ref 0.3–1.2)
Total Protein: 6.7 g/dL (ref 6.5–8.1)

## 2023-04-06 LAB — CBC WITH DIFFERENTIAL (CANCER CENTER ONLY)
Abs Immature Granulocytes: 0.01 10*3/uL (ref 0.00–0.07)
Basophils Absolute: 0 10*3/uL (ref 0.0–0.1)
Basophils Relative: 1 %
Eosinophils Absolute: 0.3 10*3/uL (ref 0.0–0.5)
Eosinophils Relative: 5 %
HCT: 39.2 % (ref 39.0–52.0)
Hemoglobin: 12.9 g/dL — ABNORMAL LOW (ref 13.0–17.0)
Immature Granulocytes: 0 %
Lymphocytes Relative: 18 %
Lymphs Abs: 1.1 10*3/uL (ref 0.7–4.0)
MCH: 31.4 pg (ref 26.0–34.0)
MCHC: 32.9 g/dL (ref 30.0–36.0)
MCV: 95.4 fL (ref 80.0–100.0)
Monocytes Absolute: 0.6 10*3/uL (ref 0.1–1.0)
Monocytes Relative: 10 %
Neutro Abs: 4 10*3/uL (ref 1.7–7.7)
Neutrophils Relative %: 66 %
Platelet Count: 197 10*3/uL (ref 150–400)
RBC: 4.11 MIL/uL — ABNORMAL LOW (ref 4.22–5.81)
RDW: 12.7 % (ref 11.5–15.5)
WBC Count: 6 10*3/uL (ref 4.0–10.5)
nRBC: 0 % (ref 0.0–0.2)

## 2023-04-06 LAB — LACTATE DEHYDROGENASE: LDH: 120 U/L (ref 98–192)

## 2023-04-06 NOTE — Progress Notes (Signed)
Hematology and Oncology Follow Up Visit  Johnny Oliver [id number] 10/09/1934 89 y.o. 04/06/2023   Principle Diagnosis:  Recurrent melanoma-treated in 2013-BRAF positive  Current Therapy:   ZELBORAF/Yervoy-completed in 2013     Interim History:  Johnny Oliver is back for follow-up.  We see him yearly.  Since we last saw him, has been doing pretty well.  Johnny Oliver has had no real complaints.  Johnny Oliver says Johnny Oliver has had a couple ticks picked off him recently.  Johnny Oliver does work in his yard.  Johnny Oliver will be going back in [place].  Johnny Oliver will go there in early July.  Johnny Oliver will be there for 3 weeks.  I know that Johnny Oliver will have a wonderful time over there.  Johnny Oliver has had no issues with swallowing.  Last sound, had a little bit of dysphagia.  Johnny Oliver has had no change in bowel bladder habits.  Johnny Oliver has had no fever.  There has been no problems with COVID.  Johnny Oliver is eating well.  Johnny Oliver has had no bleeding.  Johnny Oliver has had no nausea or vomiting.  Overall, his performance status is ECOG 0.     Medications:  Current Outpatient Medications:    Iron, Ferrous Sulfate, 325 (65 Fe) MG TABS, Take 1 tablet by mouth daily at 6 (six) AM., Disp: , Rfl:    ramipril (ALTACE) 2.5 MG capsule, Take 2.5 mg by mouth daily., Disp: , Rfl:   Allergies: No Known Allergies  Past Medical History, Surgical history, Social history, and Family History were reviewed and updated.  Review of Systems: Review of Systems  Constitutional:  Positive for unexpected weight change.  HENT:  Negative.    Eyes: Negative.   Respiratory: Negative.    Cardiovascular: Negative.   Gastrointestinal: Negative.   Endocrine: Negative.   Genitourinary: Negative.    Musculoskeletal: Negative.   Skin: Negative.   Neurological: Negative.   Hematological: Negative.   Psychiatric/Behavioral: Negative.      Physical Exam:  height is 5\' 8"  (1.727 m) and weight is 131 lb (59.4 kg). His oral temperature is 98.1 F (36.7 C). His blood pressure is 112/68 and his pulse is 57  (abnormal). His respiration is 18 and oxygen saturation is 100%.   Wt Readings from Last 3 Encounters:  04/06/23 131 lb (59.4 kg)  03/27/22 133 lb (60.3 kg)  11/05/21 131 lb 1.9 oz (59.5 kg)    Physical Exam Vitals reviewed.  HENT:     Head: Normocephalic and atraumatic.  Eyes:     Pupils: Pupils are equal, round, and reactive to light.  Cardiovascular:     Rate and Rhythm: Normal rate and regular rhythm.     Heart sounds: Normal heart sounds.  Pulmonary:     Effort: Pulmonary effort is normal.     Breath sounds: Normal breath sounds.  Abdominal:     General: Bowel sounds are normal.     Palpations: Abdomen is soft.  Musculoskeletal:        General: No tenderness or deformity. Normal range of motion.     Cervical back: Normal range of motion.  Lymphadenopathy:     Cervical: No cervical adenopathy.  Skin:    General: Skin is warm and dry.     Findings: No erythema or rash.  Neurological:     Mental Status: Johnny Oliver is alert and oriented to person, place, and time.  Psychiatric:        Behavior: Behavior normal.        Thought Content: Thought content  normal.        Judgment: Judgment normal.      Lab Results  Component Value Date   WBC 6.0 04/06/2023   HGB 12.9 (L) 04/06/2023   HCT 39.2 04/06/2023   MCV 95.4 04/06/2023   PLT 197 04/06/2023     Chemistry      Component Value Date/Time   NA 140 04/06/2023 0823   K 4.7 04/06/2023 0823   CL 106 04/06/2023 0823   CO2 30 04/06/2023 0823   BUN 16 04/06/2023 0823   CREATININE 0.84 04/06/2023 0823   CREATININE 0.76 03/27/2022 0854      Component Value Date/Time   CALCIUM 8.8 (L) 04/06/2023 0823   ALKPHOS 66 04/06/2023 0823   AST 17 04/06/2023 0823   AST 19 03/27/2022 0854   ALT 14 04/06/2023 0823   ALT 19 03/27/2022 0854   BILITOT 0.7 04/06/2023 0823   BILITOT 0.6 03/27/2022 0854       Impression and Plan: Johnny Oliver is a very nice 89 year old male.  Johnny Oliver is [place].  Johnny Oliver had a history of melanoma 11 years ago.   Johnny Oliver had this resected.  Johnny Oliver had recurrence.  Recurrence appear to be local.  Johnny Oliver was treated with targeted therapy because of the BRAF mutation.  Johnny Oliver also got immunotherapy with Yervoy.  I do not see any evidence of recurrent disease.  I do not see that we have to do any scans on him.  We will still plan to get him back in 1 year.  I think that if everything looks good this year, then we can probably let him go from the clinic tomorrow see him back next year.      Josph Macho, MD 6/11/20249:17 AM

## 2023-04-22 DIAGNOSIS — Z961 Presence of intraocular lens: Secondary | ICD-10-CM | POA: Diagnosis not present

## 2023-04-22 DIAGNOSIS — H43812 Vitreous degeneration, left eye: Secondary | ICD-10-CM | POA: Diagnosis not present

## 2023-04-22 DIAGNOSIS — H26492 Other secondary cataract, left eye: Secondary | ICD-10-CM | POA: Diagnosis not present

## 2023-04-27 DIAGNOSIS — Z85820 Personal history of malignant melanoma of skin: Secondary | ICD-10-CM | POA: Diagnosis not present

## 2023-04-27 DIAGNOSIS — D1801 Hemangioma of skin and subcutaneous tissue: Secondary | ICD-10-CM | POA: Diagnosis not present

## 2023-04-27 DIAGNOSIS — D2262 Melanocytic nevi of left upper limb, including shoulder: Secondary | ICD-10-CM | POA: Diagnosis not present

## 2023-04-27 DIAGNOSIS — L821 Other seborrheic keratosis: Secondary | ICD-10-CM | POA: Diagnosis not present

## 2023-04-27 DIAGNOSIS — D225 Melanocytic nevi of trunk: Secondary | ICD-10-CM | POA: Diagnosis not present

## 2023-04-27 DIAGNOSIS — L812 Freckles: Secondary | ICD-10-CM | POA: Diagnosis not present

## 2023-04-27 DIAGNOSIS — D2261 Melanocytic nevi of right upper limb, including shoulder: Secondary | ICD-10-CM | POA: Diagnosis not present

## 2023-04-27 DIAGNOSIS — D2271 Melanocytic nevi of right lower limb, including hip: Secondary | ICD-10-CM | POA: Diagnosis not present

## 2023-11-18 DIAGNOSIS — E611 Iron deficiency: Secondary | ICD-10-CM | POA: Diagnosis not present

## 2023-11-18 DIAGNOSIS — I10 Essential (primary) hypertension: Secondary | ICD-10-CM | POA: Diagnosis not present

## 2023-11-18 DIAGNOSIS — R7989 Other specified abnormal findings of blood chemistry: Secondary | ICD-10-CM | POA: Diagnosis not present

## 2023-11-25 DIAGNOSIS — M7062 Trochanteric bursitis, left hip: Secondary | ICD-10-CM | POA: Diagnosis not present

## 2023-11-25 DIAGNOSIS — C439 Malignant melanoma of skin, unspecified: Secondary | ICD-10-CM | POA: Diagnosis not present

## 2023-11-25 DIAGNOSIS — R1310 Dysphagia, unspecified: Secondary | ICD-10-CM | POA: Diagnosis not present

## 2023-11-25 DIAGNOSIS — Z23 Encounter for immunization: Secondary | ICD-10-CM | POA: Diagnosis not present

## 2023-11-25 DIAGNOSIS — R411 Anterograde amnesia: Secondary | ICD-10-CM | POA: Diagnosis not present

## 2023-11-25 DIAGNOSIS — Z0000 Encounter for general adult medical examination without abnormal findings: Secondary | ICD-10-CM | POA: Diagnosis not present

## 2023-11-25 DIAGNOSIS — H9391 Unspecified disorder of right ear: Secondary | ICD-10-CM | POA: Diagnosis not present

## 2023-11-25 DIAGNOSIS — M25559 Pain in unspecified hip: Secondary | ICD-10-CM | POA: Diagnosis not present

## 2023-11-25 DIAGNOSIS — Z1331 Encounter for screening for depression: Secondary | ICD-10-CM | POA: Diagnosis not present

## 2023-11-25 DIAGNOSIS — E611 Iron deficiency: Secondary | ICD-10-CM | POA: Diagnosis not present

## 2023-11-25 DIAGNOSIS — D692 Other nonthrombocytopenic purpura: Secondary | ICD-10-CM | POA: Diagnosis not present

## 2023-11-25 DIAGNOSIS — Z711 Person with feared health complaint in whom no diagnosis is made: Secondary | ICD-10-CM | POA: Diagnosis not present

## 2023-11-25 DIAGNOSIS — Z1389 Encounter for screening for other disorder: Secondary | ICD-10-CM | POA: Diagnosis not present

## 2023-11-25 DIAGNOSIS — I10 Essential (primary) hypertension: Secondary | ICD-10-CM | POA: Diagnosis not present

## 2023-11-25 DIAGNOSIS — M545 Low back pain, unspecified: Secondary | ICD-10-CM | POA: Diagnosis not present

## 2023-11-25 DIAGNOSIS — R053 Chronic cough: Secondary | ICD-10-CM | POA: Diagnosis not present

## 2023-12-16 DIAGNOSIS — T161XXA Foreign body in right ear, initial encounter: Secondary | ICD-10-CM | POA: Diagnosis not present

## 2024-01-05 DIAGNOSIS — R519 Headache, unspecified: Secondary | ICD-10-CM | POA: Diagnosis not present

## 2024-01-05 DIAGNOSIS — R058 Other specified cough: Secondary | ICD-10-CM | POA: Diagnosis not present

## 2024-01-05 DIAGNOSIS — J3489 Other specified disorders of nose and nasal sinuses: Secondary | ICD-10-CM | POA: Diagnosis not present

## 2024-01-05 DIAGNOSIS — Z1152 Encounter for screening for COVID-19: Secondary | ICD-10-CM | POA: Diagnosis not present

## 2024-01-05 DIAGNOSIS — R509 Fever, unspecified: Secondary | ICD-10-CM | POA: Diagnosis not present

## 2024-02-08 DIAGNOSIS — I10 Essential (primary) hypertension: Secondary | ICD-10-CM | POA: Diagnosis not present

## 2024-02-08 DIAGNOSIS — L282 Other prurigo: Secondary | ICD-10-CM | POA: Diagnosis not present

## 2024-03-23 DIAGNOSIS — M25559 Pain in unspecified hip: Secondary | ICD-10-CM | POA: Diagnosis not present

## 2024-03-23 DIAGNOSIS — Z711 Person with feared health complaint in whom no diagnosis is made: Secondary | ICD-10-CM | POA: Diagnosis not present

## 2024-03-23 DIAGNOSIS — M545 Low back pain, unspecified: Secondary | ICD-10-CM | POA: Diagnosis not present

## 2024-03-23 DIAGNOSIS — M7062 Trochanteric bursitis, left hip: Secondary | ICD-10-CM | POA: Diagnosis not present

## 2024-03-23 DIAGNOSIS — D692 Other nonthrombocytopenic purpura: Secondary | ICD-10-CM | POA: Diagnosis not present

## 2024-03-23 DIAGNOSIS — H9391 Unspecified disorder of right ear: Secondary | ICD-10-CM | POA: Diagnosis not present

## 2024-03-23 DIAGNOSIS — E611 Iron deficiency: Secondary | ICD-10-CM | POA: Diagnosis not present

## 2024-03-23 DIAGNOSIS — G2581 Restless legs syndrome: Secondary | ICD-10-CM | POA: Diagnosis not present

## 2024-03-23 DIAGNOSIS — I10 Essential (primary) hypertension: Secondary | ICD-10-CM | POA: Diagnosis not present

## 2024-03-23 DIAGNOSIS — R1310 Dysphagia, unspecified: Secondary | ICD-10-CM | POA: Diagnosis not present

## 2024-03-23 DIAGNOSIS — H9190 Unspecified hearing loss, unspecified ear: Secondary | ICD-10-CM | POA: Diagnosis not present

## 2024-03-23 DIAGNOSIS — R053 Chronic cough: Secondary | ICD-10-CM | POA: Diagnosis not present

## 2024-03-30 DIAGNOSIS — S52022A Displaced fracture of olecranon process without intraarticular extension of left ulna, initial encounter for closed fracture: Secondary | ICD-10-CM | POA: Diagnosis not present

## 2024-03-30 DIAGNOSIS — R55 Syncope and collapse: Secondary | ICD-10-CM | POA: Diagnosis not present

## 2024-03-30 DIAGNOSIS — S52032A Displaced fracture of olecranon process with intraarticular extension of left ulna, initial encounter for closed fracture: Secondary | ICD-10-CM | POA: Diagnosis not present

## 2024-03-30 DIAGNOSIS — S4992XA Unspecified injury of left shoulder and upper arm, initial encounter: Secondary | ICD-10-CM | POA: Diagnosis not present

## 2024-03-31 DIAGNOSIS — S42402A Unspecified fracture of lower end of left humerus, initial encounter for closed fracture: Secondary | ICD-10-CM | POA: Diagnosis not present

## 2024-04-03 DIAGNOSIS — S52022A Displaced fracture of olecranon process without intraarticular extension of left ulna, initial encounter for closed fracture: Secondary | ICD-10-CM | POA: Diagnosis not present

## 2024-04-04 ENCOUNTER — Other Ambulatory Visit: Payer: Self-pay | Admitting: *Deleted

## 2024-04-04 DIAGNOSIS — C439 Malignant melanoma of skin, unspecified: Secondary | ICD-10-CM

## 2024-04-04 DIAGNOSIS — C438 Malignant melanoma of overlapping sites of skin: Secondary | ICD-10-CM

## 2024-04-05 ENCOUNTER — Inpatient Hospital Stay: Payer: Medicare HMO | Admitting: Hematology & Oncology

## 2024-04-05 ENCOUNTER — Encounter: Payer: Self-pay | Admitting: Hematology & Oncology

## 2024-04-05 ENCOUNTER — Inpatient Hospital Stay: Payer: Medicare HMO | Attending: Hematology & Oncology

## 2024-04-05 VITALS — BP 115/55 | HR 68 | Temp 98.1°F | Resp 18 | Ht 68.0 in | Wt 134.0 lb

## 2024-04-05 DIAGNOSIS — Z9221 Personal history of antineoplastic chemotherapy: Secondary | ICD-10-CM | POA: Diagnosis not present

## 2024-04-05 DIAGNOSIS — C438 Malignant melanoma of overlapping sites of skin: Secondary | ICD-10-CM

## 2024-04-05 DIAGNOSIS — Z85820 Personal history of malignant melanoma of skin: Secondary | ICD-10-CM | POA: Diagnosis not present

## 2024-04-05 DIAGNOSIS — C439 Malignant melanoma of skin, unspecified: Secondary | ICD-10-CM

## 2024-04-05 LAB — CMP (CANCER CENTER ONLY)
ALT: 11 U/L (ref 0–44)
AST: 13 U/L — ABNORMAL LOW (ref 15–41)
Albumin: 3.8 g/dL (ref 3.5–5.0)
Alkaline Phosphatase: 78 U/L (ref 38–126)
Anion gap: 7 (ref 5–15)
BUN: 17 mg/dL (ref 8–23)
CO2: 28 mmol/L (ref 22–32)
Calcium: 8.7 mg/dL — ABNORMAL LOW (ref 8.9–10.3)
Chloride: 106 mmol/L (ref 98–111)
Creatinine: 0.88 mg/dL (ref 0.61–1.24)
GFR, Estimated: >60 mL/min (ref >60)
Glucose, Bld: 91 mg/dL (ref 70–99)
Potassium: 4.9 mmol/L (ref 3.5–5.1)
Sodium: 141 mmol/L (ref 135–145)
Total Bilirubin: 0.7 mg/dL (ref 0.0–1.2)
Total Protein: 6.9 g/dL (ref 6.5–8.1)

## 2024-04-05 LAB — CBC WITH DIFFERENTIAL (CANCER CENTER ONLY)
Abs Immature Granulocytes: 0.02 10*3/uL (ref 0.00–0.07)
Basophils Absolute: 0.1 10*3/uL (ref 0.0–0.1)
Basophils Relative: 1 %
Eosinophils Absolute: 0.4 10*3/uL (ref 0.0–0.5)
Eosinophils Relative: 5 %
HCT: 33.8 % — ABNORMAL LOW (ref 39.0–52.0)
Hemoglobin: 11.3 g/dL — ABNORMAL LOW (ref 13.0–17.0)
Immature Granulocytes: 0 %
Lymphocytes Relative: 18 %
Lymphs Abs: 1.3 10*3/uL (ref 0.7–4.0)
MCH: 31.3 pg (ref 26.0–34.0)
MCHC: 33.4 g/dL (ref 30.0–36.0)
MCV: 93.6 fL (ref 80.0–100.0)
Monocytes Absolute: 0.8 10*3/uL (ref 0.1–1.0)
Monocytes Relative: 12 %
Neutro Abs: 4.6 10*3/uL (ref 1.7–7.7)
Neutrophils Relative %: 64 %
Platelet Count: 229 10*3/uL (ref 150–400)
RBC: 3.61 MIL/uL — ABNORMAL LOW (ref 4.22–5.81)
RDW: 13.3 % (ref 11.5–15.5)
WBC Count: 7.2 10*3/uL (ref 4.0–10.5)
nRBC: 0 % (ref 0.0–0.2)

## 2024-04-05 LAB — LACTATE DEHYDROGENASE: LDH: 121 U/L (ref 98–192)

## 2024-04-05 NOTE — Progress Notes (Signed)
 Hematology and Oncology Follow Up Visit  Johnny Oliver [id number] [birth year] 89 y.o. 04/05/2024   Principle Diagnosis:  Recurrent melanoma-treated in 2013-BRAF positive  Current Therapy:   ZELBORAF/Yervoy-completed in 2013     Interim History:  Johnny Oliver is back for follow-up.  We see him once a year.  Unfortunately, few days ago, he fell and broke his left elbow.  This happened while he was walking his dog.  Sounds like he does not need any surgery.  Hopefully, this will heal up on its own.  Otherwise, he seems to be doing okay.  He has is had no problems since we last saw him.  He did go back to [place].  He had a wonderful time in [place].  He has had no problems with nausea or vomiting.  He has had no problems with cough or shortness of breath.  He has had no change in bowel or bladder habits.  He has had no unusual skin lesions.  Overall, his performance status is ECOG 1.    Medications:  Current Outpatient Medications:    Iron, Ferrous Sulfate, 325 (65 Fe) MG TABS, Take 1 tablet by mouth daily at 6 (six) AM., Disp: , Rfl:    oxyCODONE-acetaminophen (PERCOCET/ROXICET) 5-325 MG tablet, Take 1-2 tablets by mouth every 6 (six) hours as needed., Disp: , Rfl:    ramipril (ALTACE) 2.5 MG capsule, Take 2.5 mg by mouth daily., Disp: , Rfl:   Allergies: No Known Allergies  Past Medical History, Surgical history, Social history, and Family History were reviewed and updated.  Review of Systems: Review of Systems  Constitutional:  Positive for unexpected weight change.  HENT:  Negative.    Eyes: Negative.   Respiratory: Negative.    Cardiovascular: Negative.   Gastrointestinal: Negative.   Endocrine: Negative.   Genitourinary: Negative.    Musculoskeletal: Negative.   Skin: Negative.   Neurological: Negative.   Hematological: Negative.   Psychiatric/Behavioral: Negative.      Physical Exam:  height is 5' 8 (1.727 m) and weight is 134 lb (60.8 kg). His oral  temperature is 98.1 F (36.7 C). His blood pressure is 115/55 (abnormal) and his pulse is 68. His respiration is 18 and oxygen saturation is 100%.   Wt Readings from Last 3 Encounters:  04/05/24 134 lb (60.8 kg)  04/06/23 131 lb (59.4 kg)  03/27/22 133 lb (60.3 kg)    Physical Exam Vitals reviewed.  HENT:     Head: Normocephalic and atraumatic.  Eyes:     Pupils: Pupils are equal, round, and reactive to light.  Cardiovascular:     Rate and Rhythm: Normal rate and regular rhythm.     Heart sounds: Normal heart sounds.  Pulmonary:     Effort: Pulmonary effort is normal.     Breath sounds: Normal breath sounds.  Abdominal:     General: Bowel sounds are normal.     Palpations: Abdomen is soft.  Musculoskeletal:        General: No tenderness or deformity. Normal range of motion.     Cervical back: Normal range of motion.  Lymphadenopathy:     Cervical: No cervical adenopathy.  Skin:    General: Skin is warm and dry.     Findings: No erythema or rash.  Neurological:     Mental Status: He is alert and oriented to person, place, and time.  Psychiatric:        Behavior: Behavior normal.        Thought Content:  Thought content normal.        Judgment: Judgment normal.      Lab Results  Component Value Date   WBC 7.2 04/05/2024   HGB 11.3 (L) 04/05/2024   HCT 33.8 (L) 04/05/2024   MCV 93.6 04/05/2024   PLT 229 04/05/2024     Chemistry      Component Value Date/Time   NA 140 04/06/2023 0823   K 4.7 04/06/2023 0823   CL 106 04/06/2023 0823   CO2 30 04/06/2023 0823   BUN 16 04/06/2023 0823   CREATININE 0.84 04/06/2023 0823   CREATININE 0.76 03/27/2022 0854      Component Value Date/Time   CALCIUM 8.8 (L) 04/06/2023 0823   ALKPHOS 66 04/06/2023 0823   AST 17 04/06/2023 0823   AST 19 03/27/2022 0854   ALT 14 04/06/2023 0823   ALT 19 03/27/2022 0854   BILITOT 0.7 04/06/2023 0823   BILITOT 0.6 03/27/2022 0854       Impression and Plan: Johnny Oliver is a very  nice 89 year old male.  He is [place].  He had a history of melanoma 12 years ago.  He had this resected.  He had recurrence.  Recurrence appear to be local.  He was treated with targeted therapy because of the BRAF mutation.  He also got immunotherapy with Yervoy.  I do not see any evidence of recurrent disease.  I do not see that we have to do any scans on him.  I really hope that the left elbow heals up.  Hopefully he will not need surgery for this.  At this point, we will let him go from our Clinic.  I really do not think that he is good have a problem with recurrence.  If he does have any issues, he knows that he can always give us  a call.   [name], MD 6/11/20258:21 AM

## 2024-04-06 LAB — CARDIOLIPIN ANTIBODIES, IGG, IGM, IGA
Anticardiolipin IgA: <9 U/mL (ref 0–11)
Anticardiolipin IgG: <9 GPL U/mL (ref 0–14)
Anticardiolipin IgM: 9 [MPL'U]/mL (ref 0–12)

## 2024-04-07 DIAGNOSIS — S52022D Displaced fracture of olecranon process without intraarticular extension of left ulna, subsequent encounter for closed fracture with routine healing: Secondary | ICD-10-CM | POA: Diagnosis not present

## 2024-04-14 DIAGNOSIS — S52022D Displaced fracture of olecranon process without intraarticular extension of left ulna, subsequent encounter for closed fracture with routine healing: Secondary | ICD-10-CM | POA: Diagnosis not present

## 2024-04-24 DIAGNOSIS — S52022D Displaced fracture of olecranon process without intraarticular extension of left ulna, subsequent encounter for closed fracture with routine healing: Secondary | ICD-10-CM | POA: Diagnosis not present

## 2024-05-04 DIAGNOSIS — H26493 Other secondary cataract, bilateral: Secondary | ICD-10-CM | POA: Diagnosis not present

## 2024-05-04 DIAGNOSIS — H53001 Unspecified amblyopia, right eye: Secondary | ICD-10-CM | POA: Diagnosis not present

## 2024-05-04 DIAGNOSIS — H43812 Vitreous degeneration, left eye: Secondary | ICD-10-CM | POA: Diagnosis not present

## 2024-05-04 DIAGNOSIS — Z961 Presence of intraocular lens: Secondary | ICD-10-CM | POA: Diagnosis not present

## 2024-05-09 ENCOUNTER — Ambulatory Visit: Admitting: Family Medicine

## 2024-05-22 DIAGNOSIS — S52022D Displaced fracture of olecranon process without intraarticular extension of left ulna, subsequent encounter for closed fracture with routine healing: Secondary | ICD-10-CM | POA: Diagnosis not present

## 2024-05-31 DIAGNOSIS — D2271 Melanocytic nevi of right lower limb, including hip: Secondary | ICD-10-CM | POA: Diagnosis not present

## 2024-05-31 DIAGNOSIS — D2272 Melanocytic nevi of left lower limb, including hip: Secondary | ICD-10-CM | POA: Diagnosis not present

## 2024-05-31 DIAGNOSIS — Z85820 Personal history of malignant melanoma of skin: Secondary | ICD-10-CM | POA: Diagnosis not present

## 2024-05-31 DIAGNOSIS — L853 Xerosis cutis: Secondary | ICD-10-CM | POA: Diagnosis not present

## 2024-05-31 DIAGNOSIS — L821 Other seborrheic keratosis: Secondary | ICD-10-CM | POA: Diagnosis not present

## 2024-05-31 DIAGNOSIS — D2262 Melanocytic nevi of left upper limb, including shoulder: Secondary | ICD-10-CM | POA: Diagnosis not present

## 2024-05-31 DIAGNOSIS — D1801 Hemangioma of skin and subcutaneous tissue: Secondary | ICD-10-CM | POA: Diagnosis not present

## 2024-05-31 DIAGNOSIS — L918 Other hypertrophic disorders of the skin: Secondary | ICD-10-CM | POA: Diagnosis not present

## 2024-05-31 DIAGNOSIS — D225 Melanocytic nevi of trunk: Secondary | ICD-10-CM | POA: Diagnosis not present

## 2024-06-12 DIAGNOSIS — M25622 Stiffness of left elbow, not elsewhere classified: Secondary | ICD-10-CM | POA: Diagnosis not present

## 2024-06-12 DIAGNOSIS — S52022D Displaced fracture of olecranon process without intraarticular extension of left ulna, subsequent encounter for closed fracture with routine healing: Secondary | ICD-10-CM | POA: Diagnosis not present

## 2024-06-12 DIAGNOSIS — M6281 Muscle weakness (generalized): Secondary | ICD-10-CM | POA: Diagnosis not present

## 2024-06-19 DIAGNOSIS — S52022D Displaced fracture of olecranon process without intraarticular extension of left ulna, subsequent encounter for closed fracture with routine healing: Secondary | ICD-10-CM | POA: Diagnosis not present

## 2024-06-19 DIAGNOSIS — M25622 Stiffness of left elbow, not elsewhere classified: Secondary | ICD-10-CM | POA: Diagnosis not present

## 2024-06-19 DIAGNOSIS — M6281 Muscle weakness (generalized): Secondary | ICD-10-CM | POA: Diagnosis not present

## 2024-07-03 NOTE — Progress Notes (Unsigned)
 [facility] [address] [place] [address] Phone: [phone] Subjective:   Johnny Oliver, am serving as a scribe for Dr. Arthea Claudene.  I'm seeing this patient by the request  of:  Valentin Skates, DO  CC: Right hip pain.  [name]  Johnny Oliver is a 89 y.o. male coming in with complaint of R hip pain. Patient states that his pain started 2 years ago. Pain in GT that worsens when walking up inclines. Denies any radiating symptoms.    Patient is being followed for malignant melanoma  Did follow endocrine Reviewing patient's chart, has been seen for pain in the hip with more of a greater trochanteric bursitis of the left hip.  No past medical history on file. No past surgical history on file. Social History   Socioeconomic History   Marital status: Married    Spouse name: Not on file   Number of children: Not on file   Years of education: Not on file   Highest education level: Not on file  Occupational History   Not on file  Tobacco Use   Smoking status: Never   Smokeless tobacco: Not on file  Vaping Use   Vaping status: Never Used  Substance and Sexual Activity   Alcohol  use: Never   Drug use: Never   Sexual activity: Not on file  Other Topics Concern   Not on file  Social History Narrative   Not on file   Social Drivers of Health   Financial Resource Strain: Not on file  Food Insecurity: Not on file  Transportation Needs: Not on file  Physical Activity: Insufficiently Active (08/29/2019)   Received from [facility]   Exercise Vital Sign    Days of Exercise per Week: 1 day    Minutes of Exercise per Session: 20 min  Stress: Not on file  Social Connections: Not on file   No Known Allergies No family history on file.   Current Outpatient Medications (Cardiovascular):    ramipril (ALTACE) 2.5 MG capsule, Take 2.5 mg by mouth daily.   Current Outpatient Medications (Analgesics):     oxyCODONE-acetaminophen (PERCOCET/ROXICET) 5-325 MG tablet, Take 1-2 tablets by mouth every 6 (six) hours as needed.  Current Outpatient Medications (Hematological):    Iron, Ferrous Sulfate, 325 (65 Fe) MG TABS, Take 1 tablet by mouth daily at 6 (six) AM.    Reviewed prior external information including notes and imaging from  primary care provider As well as notes that were available from care everywhere and other healthcare systems.  Past medical history, social, surgical and family history all reviewed in electronic medical record.  No pertanent information unless stated regarding to the chief complaint.   Review of Systems:  No headache, visual changes, nausea, vomiting, diarrhea, constipation, dizziness, abdominal pain, skin rash, fevers, chills, night sweats, weight loss, swollen lymph nodes, body aches, joint swelling, chest pain, shortness of breath, mood changes. POSITIVE muscle aches  Objective  Blood pressure 122/78, pulse 67, height 5' 8 (1.727 m), weight 130 lb (59 kg), SpO2 97%.   General: No apparent distress alert and oriented x3 mood and affect normal, dressed appropriately.  HEENT: Pupils equal, extraocular movements intact  Respiratory: Patient's speak in full sentences and does not appear short of breath  Cardiovascular: No lower extremity edema, non tender, no erythema  Hip exam shows relatively good range of motion.  Mild varus deformity of the right knee noted.  Patient has a positive FABER  test.  Negative straight leg test.  More tenderness over the gluteal insertion posteriorly around the greater trochanteric area.  No pain with internal rotation of the hip.  Neurovascularly intact distally.  5 out of 5 strength of the lower extremity deep tendon reflexes intact  [id number]; 15 additional minutes spent for Therapeutic exercises as stated in above notes.  This included exercises focusing on stretching, strengthening, with significant focus on eccentric aspects.   Long  term goals include an improvement in range of motion, strength, endurance as well as avoiding reinjury. Patient's frequency would include in 1-2 times a day, 3-5 times a week for a duration of 6-12 weeks.Hip strengthening exercises which included:  Pelvic tilt/bracing to help with proper recruitment of the lower abs and pelvic floor muscles  Glute strengthening to properly contract glutes without over-engaging low back and hamstrings - prone hip extension and glute bridge exercises Proper stretching techniques to increase effectiveness for the hip flexors, groin, quads, piriformic and low back when appropriate    Proper technique shown and discussed handout in great detail with ATC.  All questions were discussed and answered.      Impression and Recommendations:     The above documentation has been reviewed and is accurate and complete Semaje Kinker M Darren Nodal, DO

## 2024-07-04 ENCOUNTER — Encounter: Payer: Self-pay | Admitting: Family Medicine

## 2024-07-04 ENCOUNTER — Ambulatory Visit

## 2024-07-04 ENCOUNTER — Ambulatory Visit: Admitting: Family Medicine

## 2024-07-04 VITALS — BP 122/78 | HR 67 | Ht 68.0 in | Wt 130.0 lb

## 2024-07-04 DIAGNOSIS — G8929 Other chronic pain: Secondary | ICD-10-CM | POA: Diagnosis not present

## 2024-07-04 DIAGNOSIS — M25551 Pain in right hip: Secondary | ICD-10-CM | POA: Diagnosis not present

## 2024-07-04 DIAGNOSIS — M7601 Gluteal tendinitis, right hip: Secondary | ICD-10-CM | POA: Diagnosis not present

## 2024-07-04 DIAGNOSIS — M1611 Unilateral primary osteoarthritis, right hip: Secondary | ICD-10-CM | POA: Diagnosis not present

## 2024-07-04 NOTE — Assessment & Plan Note (Signed)
 Seems to be more of a gluteal tendinitis.  Seems to be severe in this area.  Does have the malignant melanoma so if no significant benefit I would consider either the possibility of injections versus the possibility of imaging.  Patient though is not had any fevers, chills, any abnormal weight loss.  Continues to be extremely active.  Discussed icing and topical anti-inflammatories.  Home exercises for hip abductor strengthening given.  Follow-up with me again in 2 months otherwise.

## 2024-07-04 NOTE — Patient Instructions (Addendum)
 Xray today Glute tendon ex Ice for 20 min Arnica lotion On cloud or new shoes Change direction on walks See me in 2 months

## 2024-07-11 ENCOUNTER — Ambulatory Visit: Payer: Self-pay | Admitting: Family Medicine

## 2024-09-04 NOTE — Progress Notes (Unsigned)
 [facility] [address] [place] [address] Phone: [phone] Subjective:   [facility] am a scribe for Dr. Claudene.   I'm seeing this patient by the request  of:  Valentin Skates, DO  CC: Right gluteal pain  [name]  07/04/2024 Seems to be more of a gluteal tendinitis.  Seems to be severe in this area.  Does have the malignant melanoma so if no significant benefit I would consider either the possibility of injections versus the possibility of imaging.  Patient though is not had any fevers, chills, any abnormal weight loss.  Continues to be extremely active.  Discussed icing and topical anti-inflammatories.  Home exercises for hip abductor strengthening given.  Follow-up with me again in 2 months otherwise.    Update 09/05/2024 Johnny [facility] is a 89 y.o. male coming in with complaint of R glute pain.  Seem to have more right gluteal tendinitis.  Past medical history though is significant for cutaneous lupus as well as history of malignant melanoma.  Patient states that the pain is much better today. No flare ups of pain over the weekend.     X-rays of the right hip were independently visualized by me showing that patient did have some mild to moderate narrowing of the joint with some osteophyte formation.  No past medical history on file. No past surgical history on file. Social History   Socioeconomic History   Marital status: Married    Spouse name: Not on file   Number of children: Not on file   Years of education: Not on file   Highest education level: Not on file  Occupational History   Not on file  Tobacco Use   Smoking status: Never   Smokeless tobacco: Not on file  Vaping Use   Vaping status: Never Used  Substance and Sexual Activity   Alcohol  use: Never   Drug use: Never   Sexual activity: Not on file  Other Topics Concern   Not on file  Social History Narrative   Not on file   Social Drivers of Health    Financial Resource Strain: Not on file  Food Insecurity: Not on file  Transportation Needs: Not on file  Physical Activity: Insufficiently Active (08/29/2019)   Received from [facility]   Exercise Vital Sign    Days of Exercise per Week: 1 day    Minutes of Exercise per Session: 20 min  Stress: Not on file  Social Connections: Not on file   No Known Allergies No family history on file.   Current Outpatient Medications (Cardiovascular):    ramipril (ALTACE) 2.5 MG capsule, Take 2.5 mg by mouth daily.   Current Outpatient Medications (Analgesics):    oxyCODONE-acetaminophen (PERCOCET/ROXICET) 5-325 MG tablet, Take 1-2 tablets by mouth every 6 (six) hours as needed.  Current Outpatient Medications (Hematological):    Iron, Ferrous Sulfate, 325 (65 Fe) MG TABS, Take 1 tablet by mouth daily at 6 (six) AM.    Objective  Pulse (!) 58, height 5' 8 (1.727 m), weight 132 lb 9.6 oz (60.1 kg), SpO2 99%.   General: No apparent distress alert and oriented x3 mood and affect normal, dressed appropriately.  HEENT: Pupils equal, extraocular movements intact  Respiratory: Patient's speak in full sentences and does not appear short of breath  Cardiovascular: No lower extremity edema, non tender, no erythema  Right hip exam shows tenderness to palp and very minorly over the greater trochanteric area.  No  improvement in range of motion.  Improvement in strength but still has some mild weakness with foot dorsiflexion compared to contralateral side and still has some limited external rotation of the hip compared to the contralateral side.    Impression and Recommendations:     The above documentation has been reviewed and is accurate and complete Johnny Burlison M [name], DO

## 2024-09-05 ENCOUNTER — Ambulatory Visit: Admitting: Family Medicine

## 2024-09-05 VITALS — HR 58 | Ht 68.0 in | Wt 132.6 lb

## 2024-09-05 DIAGNOSIS — M7601 Gluteal tendinitis, right hip: Secondary | ICD-10-CM

## 2024-09-05 NOTE — Assessment & Plan Note (Signed)
 Patient is doing much better at this time.  Continue with the strengthening exercises, patient has gotten new shoes which has been beneficial.  Discussed icing regimen and home exercises, increase activity slowly.  Follow-up with me as needed

## 2024-12-01 ENCOUNTER — Other Ambulatory Visit (HOSPITAL_COMMUNITY): Payer: Self-pay | Admitting: Internal Medicine

## 2024-12-01 DIAGNOSIS — R059 Cough, unspecified: Secondary | ICD-10-CM

## 2024-12-01 DIAGNOSIS — R1310 Dysphagia, unspecified: Secondary | ICD-10-CM

## 2024-12-25 ENCOUNTER — Encounter (HOSPITAL_COMMUNITY)
